# Patient Record
Sex: Female | Born: 1950 | Race: White | Hispanic: No | Marital: Married | Smoking: Former smoker
Health system: Southern US, Community
[De-identification: ages and names within clinical notes are randomized; demographics above are authoritative.]

## PROBLEM LIST (undated history)

## (undated) DIAGNOSIS — E785 Hyperlipidemia, unspecified: Secondary | ICD-10-CM

## (undated) DIAGNOSIS — A159 Respiratory tuberculosis unspecified: Secondary | ICD-10-CM

## (undated) DIAGNOSIS — H409 Unspecified glaucoma: Secondary | ICD-10-CM

## (undated) DIAGNOSIS — E7800 Pure hypercholesterolemia, unspecified: Secondary | ICD-10-CM

## (undated) DIAGNOSIS — E669 Obesity, unspecified: Secondary | ICD-10-CM

## (undated) DIAGNOSIS — H25019 Cortical age-related cataract, unspecified eye: Secondary | ICD-10-CM

## (undated) DIAGNOSIS — J342 Deviated nasal septum: Secondary | ICD-10-CM

## (undated) DIAGNOSIS — M1990 Unspecified osteoarthritis, unspecified site: Secondary | ICD-10-CM

## (undated) DIAGNOSIS — T7840XA Allergy, unspecified, initial encounter: Secondary | ICD-10-CM

## (undated) DIAGNOSIS — I10 Essential (primary) hypertension: Secondary | ICD-10-CM

## (undated) DIAGNOSIS — J449 Chronic obstructive pulmonary disease, unspecified: Secondary | ICD-10-CM

## (undated) DIAGNOSIS — E119 Type 2 diabetes mellitus without complications: Secondary | ICD-10-CM

## (undated) DIAGNOSIS — J45909 Unspecified asthma, uncomplicated: Secondary | ICD-10-CM

## (undated) HISTORY — DX: Unspecified asthma, uncomplicated: J45.909

## (undated) HISTORY — DX: Hyperlipidemia, unspecified: E78.5

## (undated) HISTORY — DX: Essential (primary) hypertension: I10

## (undated) HISTORY — PX: CT ARTHROGRAM KNEE RT WO (ARMC HX): HXRAD992

## (undated) HISTORY — DX: Allergy, unspecified, initial encounter: T78.40XA

## (undated) HISTORY — PX: NASAL FRACTURE SURGERY: SHX718

---

## 2007-01-22 ENCOUNTER — Emergency Department: Payer: Self-pay | Admitting: Emergency Medicine

## 2007-06-19 ENCOUNTER — Ambulatory Visit: Payer: Self-pay | Admitting: Unknown Physician Specialty

## 2007-07-30 ENCOUNTER — Ambulatory Visit: Payer: Self-pay | Admitting: Family Medicine

## 2011-02-09 ENCOUNTER — Ambulatory Visit: Payer: Self-pay | Admitting: Unknown Physician Specialty

## 2011-02-13 LAB — PATHOLOGY REPORT

## 2011-05-23 ENCOUNTER — Ambulatory Visit: Payer: Self-pay | Admitting: Family Medicine

## 2011-06-01 ENCOUNTER — Ambulatory Visit: Payer: Self-pay | Admitting: Family Medicine

## 2012-09-18 ENCOUNTER — Ambulatory Visit: Payer: Self-pay | Admitting: Family Medicine

## 2013-09-19 ENCOUNTER — Ambulatory Visit: Payer: Self-pay | Admitting: Family Medicine

## 2013-09-30 ENCOUNTER — Ambulatory Visit: Payer: Self-pay | Admitting: Family Medicine

## 2014-11-03 ENCOUNTER — Ambulatory Visit: Payer: Self-pay | Admitting: Family Medicine

## 2015-09-16 DIAGNOSIS — Z23 Encounter for immunization: Secondary | ICD-10-CM | POA: Diagnosis not present

## 2015-10-12 ENCOUNTER — Other Ambulatory Visit: Payer: Self-pay | Admitting: Family Medicine

## 2015-10-12 DIAGNOSIS — Z1231 Encounter for screening mammogram for malignant neoplasm of breast: Secondary | ICD-10-CM

## 2015-10-12 DIAGNOSIS — J309 Allergic rhinitis, unspecified: Secondary | ICD-10-CM | POA: Diagnosis not present

## 2015-10-12 DIAGNOSIS — Z136 Encounter for screening for cardiovascular disorders: Secondary | ICD-10-CM | POA: Diagnosis not present

## 2015-10-12 DIAGNOSIS — I10 Essential (primary) hypertension: Secondary | ICD-10-CM | POA: Diagnosis not present

## 2015-10-12 DIAGNOSIS — Z23 Encounter for immunization: Secondary | ICD-10-CM | POA: Diagnosis not present

## 2015-10-12 DIAGNOSIS — Z9181 History of falling: Secondary | ICD-10-CM | POA: Diagnosis not present

## 2015-10-12 DIAGNOSIS — Z0000 Encounter for general adult medical examination without abnormal findings: Secondary | ICD-10-CM | POA: Diagnosis not present

## 2015-10-12 DIAGNOSIS — Z1389 Encounter for screening for other disorder: Secondary | ICD-10-CM | POA: Diagnosis not present

## 2015-10-12 DIAGNOSIS — J45909 Unspecified asthma, uncomplicated: Secondary | ICD-10-CM | POA: Diagnosis not present

## 2015-10-12 DIAGNOSIS — R7303 Prediabetes: Secondary | ICD-10-CM | POA: Diagnosis not present

## 2015-10-12 DIAGNOSIS — E2839 Other primary ovarian failure: Secondary | ICD-10-CM

## 2015-10-25 ENCOUNTER — Encounter: Payer: Medicare Other | Attending: Family Medicine | Admitting: Dietician

## 2015-10-25 ENCOUNTER — Encounter: Payer: Self-pay | Admitting: Dietician

## 2015-10-25 VITALS — BP 132/78 | Ht 65.0 in | Wt 246.5 lb

## 2015-10-25 DIAGNOSIS — E119 Type 2 diabetes mellitus without complications: Secondary | ICD-10-CM | POA: Diagnosis not present

## 2015-10-25 NOTE — Patient Instructions (Signed)
  Check blood sugars 2 x day before breakfast and 2 hrs after supper every day Exercise:  Start walking  for   15-30  minutes   4  days a week Avoid sugar sweetened drinks (soda, tea, coffee, sports drinks, fruit juices) Limit intake of snack foods and sweets/desserts Make healthy food choices Eat 3 meals day,  1  snack a day at bedtime Space meals 4-6 hours apart Bring blood sugar records to the next appointment/class Return for appointment/classes on:  11-18-15

## 2015-10-25 NOTE — Progress Notes (Signed)
Diabetes Self-Management Education  Visit Type: First/Initial  Appt. Start Time: 1100   Appt. End Time:1200  10/25/2015  Ms. Sena Slate, identified by name and date of birth, is a 64 y.o. female with a diagnosis of Diabetes: Type 2.   ASSESSMENT  Blood pressure 132/78, height 5\' 5"  (1.651 m), weight 246 lb 8 oz (111.812 kg). Body mass index is 41.02 kg/(m^2). Obesity Lacks knowledge of diabetes management Pt reports recent BP was elevated and MD doubled  her Maxzide dose      Diabetes Self-Management Education - 10/25/15 1238    Visit Information   Visit Type First/Initial   Initial Visit   Diabetes Type Type 2   Health Coping   How would you rate your overall health? Fair   Psychosocial Assessment   Patient Belief/Attitude about Diabetes Motivated to manage diabetes   Self-care barriers None   Special Needs None   Preferred Learning Style Hands on   Learning Readiness Ready   Complications   How often do you check your blood sugar? 0 times/day (not testing)   Have you had a dilated eye exam in the past 12 months? Yes   Have you had a dental exam in the past 12 months? Yes   Are you checking your feet? Yes   How many days per week are you checking your feet? 1   Dietary Intake   Lunch --  eats sweets 4-5x/day   Snack (afternoon) --  eats snack at 3p=chips, pretzels or ice cream   Snack (evening) --  eats snack at 8-9p=chips, pretzels or ice cream   Beverage(s) --  drinks 6-7 glasses of water per day + unsweetened beverages 2-3/day   Exercise   Exercise Type ADL's  no regular exercise   Patient Education   Previous Diabetes Education No   Disease state  --  discussed type 2 diabets and how diagnosed and treatment options   Nutrition management  Role of diet in the treatment of diabetes and the relationship between the three main macronutrients and blood glucose level;Food label reading, portion sizes and measuring food.;Carbohydrate counting   Physical activity  and exercise  Role of exercise on diabetes management, blood pressure control and cardiac health.   Monitoring Taught/evaluated SMBG meter.;Purpose and frequency of SMBG.;Yearly dilated eye exam;Taught/discussed recording of test results and interpretation of SMBG.;Identified appropriate SMBG and/or A1C goals.  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 Patient education plan is to attend individual and/or group sessions per assessed needs and concerns.     Patient Instructions   Check blood sugars 2 x day before breakfast and 2 hrs after supper every day Exercise:  Start walking  for   15-30  minutes   4  days a week Avoid sugar sweetened drinks (soda, tea, coffee, sports drinks, fruit juices) Limit intake of snack foods and sweets/desserts Make healthy food choices Eat 3 meals day,  1  snack a day at bedtime Space meals 4-6 hours apart Bring blood sugar records to the next appointment/class Return for  appointment/classes on:  11-18-15    Education material provided: General Meal Planning Guidelines, Accuchek Aviva Connect meter with 10 test strips  If problems or questions, patient to contact team via:  [phone]  Future DSME appointment:  11-18-15

## 2015-11-17 ENCOUNTER — Ambulatory Visit
Admission: RE | Admit: 2015-11-17 | Discharge: 2015-11-17 | Disposition: A | Discharge status: Home / Self Care | Payer: Medicare Other | Source: Ambulatory Visit | Attending: Family Medicine | Admitting: Family Medicine

## 2015-11-17 DIAGNOSIS — Z1231 Encounter for screening mammogram for malignant neoplasm of breast: Secondary | ICD-10-CM | POA: Diagnosis not present

## 2015-11-17 DIAGNOSIS — E2839 Other primary ovarian failure: Secondary | ICD-10-CM | POA: Insufficient documentation

## 2015-11-17 DIAGNOSIS — M8588 Other specified disorders of bone density and structure, other site: Secondary | ICD-10-CM | POA: Diagnosis not present

## 2015-11-17 DIAGNOSIS — Z780 Asymptomatic menopausal state: Secondary | ICD-10-CM | POA: Insufficient documentation

## 2015-11-18 ENCOUNTER — Encounter: Payer: Self-pay | Admitting: Dietician

## 2015-11-18 ENCOUNTER — Encounter: Payer: Medicare Other | Admitting: Dietician

## 2015-11-18 VITALS — Ht 65.0 in | Wt 241.0 lb

## 2015-11-18 DIAGNOSIS — E119 Type 2 diabetes mellitus without complications: Secondary | ICD-10-CM

## 2015-11-18 NOTE — Progress Notes (Signed)
Appt. Start Time: 9:00am Appt. End Time: 12:00 pm  Class 1 Diabetes Overview - define DM; state own type of DM; identify functions of pancreas and insulin; define insulin deficiency vs insulin resistance  Psychosocial - identify DM as a source of stress; state the effects of stress on BG control; verbalize appropriate stress management techniques; identify personal stress issues   Nutritional Management - describe effects of food on blood glucose; identify sources of carbohydrate, protein and fat; verbalize the importance of balance meals in controlling blood glucose; identify meals as well balanced or not; estimate servings of carbohydrate from menus; use food labels to identify servings size, content of carbohydrate, fiber, protein, fat, saturated fat and sodium; recognize food sources of fat, saturated fat, trans fat, sodium and verbalize goals for intake; describe healthful appropriate food choices when dining out   Exercise - describe the effects of exercise on blood glucose and importance of regular exercise in controlling diabetes; state a plan for personal exercise; verbalize contraindications for exercise  Self-Monitoring - state importance of HBGM and demo procedure accurately; use HBGM results to effectively manage diabetes; identify importance of regular HbA1C testing and goals for results  Acute Complications/Sick Day Guidelines - recognize hyperglycemia and hypoglycemia with causes and effects; identify blood glucose results as high, low or in control; list steps in treating and preventing high and low blood glucose; state appropriate measure to manage blood glucose when ill (need for meds, HBGM plan, when to call physician, need for fluids)  Chronic Complications/Foot, Skin, Eye Dental Care - identify possible long-term complications of diabetes (retinopathy, neuropathy, nephropathy, cardiovascular disease, infections); explain steps in prevention and treatment of chronic complications;  state importance of daily self-foot exams; describe how to examine feet and what to look for; explain appropriate eye and dental care  Lifestyle Changes/Goals & Health/Community Resources - state benefits of making appropriate lifestyle changes; identify habits that need to change (meals, tobacco, alcohol); identify strategies to reduce risk factors for personal health; set goals for proper diabetes care; state need for and frequency of healthcare follow-up; describe appropriate community resources for good health (ADA, web sites, apps)   Pregnancy/Sexual Health - define gestational diabetes; state importance of good blood glucose control and birth control prior to pregnancy; state importance of good blood glucose control in preventing sexual problems (impotence, vaginal dryness, infections, loss of desire); state relationship of blood glucose control and pregnancy outcome; describe risk of maternal and fetal complications  Teaching Materials Used: Class 1 Slides/Notebook Diabetes Booklet ID Card  Medic Alert/Medic ID Forms Sleep Evaluation Exercise Handout Daily Food Record Planning a Balanced Meal Goals for Class 1 

## 2015-11-25 ENCOUNTER — Encounter: Payer: Medicare Other | Attending: Family Medicine | Admitting: *Deleted

## 2015-11-25 ENCOUNTER — Encounter: Payer: Self-pay | Admitting: *Deleted

## 2015-11-25 VITALS — Wt 239.2 lb

## 2015-11-25 DIAGNOSIS — E119 Type 2 diabetes mellitus without complications: Secondary | ICD-10-CM

## 2015-11-25 NOTE — Progress Notes (Signed)

## 2015-11-26 DIAGNOSIS — E119 Type 2 diabetes mellitus without complications: Secondary | ICD-10-CM | POA: Diagnosis not present

## 2015-12-02 ENCOUNTER — Encounter: Payer: Medicare Other | Admitting: Dietician

## 2015-12-02 ENCOUNTER — Encounter: Payer: Self-pay | Admitting: Dietician

## 2015-12-02 VITALS — BP 128/82 | Ht 65.0 in | Wt 236.9 lb

## 2015-12-02 DIAGNOSIS — R0602 Shortness of breath: Secondary | ICD-10-CM | POA: Diagnosis not present

## 2015-12-02 DIAGNOSIS — E119 Type 2 diabetes mellitus without complications: Secondary | ICD-10-CM

## 2015-12-02 DIAGNOSIS — G479 Sleep disorder, unspecified: Secondary | ICD-10-CM | POA: Diagnosis not present

## 2015-12-02 DIAGNOSIS — R0609 Other forms of dyspnea: Secondary | ICD-10-CM | POA: Diagnosis not present

## 2015-12-02 DIAGNOSIS — J449 Chronic obstructive pulmonary disease, unspecified: Secondary | ICD-10-CM | POA: Diagnosis not present

## 2015-12-02 DIAGNOSIS — J45909 Unspecified asthma, uncomplicated: Secondary | ICD-10-CM | POA: Diagnosis not present

## 2015-12-02 DIAGNOSIS — E6609 Other obesity due to excess calories: Secondary | ICD-10-CM | POA: Diagnosis not present

## 2015-12-02 DIAGNOSIS — Z8611 Personal history of tuberculosis: Secondary | ICD-10-CM | POA: Diagnosis not present

## 2015-12-02 NOTE — Progress Notes (Signed)
Appt. Start Time: 9:00   Appt. End Time: 12:00  Class 3 Psychosocial - identify DM as a source of stress; state the effects of stress on BG control; verbalize appropriate stress management techniques; identify personal stress issues   Nutritional Management - describe effects of food on blood glucose; identify sources of carbohydrate, protein and fat; verbalize the importance of balance meals in controlling blood glucose; identify meals as well balanced or not; estimate servings of carbohydrate from menus; use food labels to identify servings size, content of carbohydrate, fiber, protein, fat, saturated fat and sodium; recognize food sources of fat, saturated fat, trans fat, sodium and verbalize goals for intake; describe healthful appropriate food choices when dining out   Exercise - describe the effects of exercise on blood glucose and importance of regular exercise in controlling diabetes; state a plan for personal exercise; verbalize contraindications for exercise  Self-Monitoring - state importance of HBGM and demo procedure accurately; use HBGM results to effectively manage diabetes; identify importance of regular HbA1C testing and goals for results  Acute Complications/Sick Day Guidelines - recognize hyperglycemia and hypoglycemia with causes and effects; identify blood glucose results as high, low or in control; list steps in treating and preventing high and low blood glucose; state appropriate measure to manage blood glucose when ill (need for meds, HBGM plan, when to call physician, need for fluids)  Chronic Complications/Foot, Skin, Eye Dental Care - identify possible long-term complications of diabetes (retinopathy, neuropathy, nephropathy, cardiovascular disease, infections); explain steps in prevention and treatment of chronic complications; state importance of daily self-foot exams; describe how to examine feet and what to look for; explain appropriate eye and dental care  Lifestyle  Changes/Goals & Health/Community Resources - state benefits of making appropriate lifestyle changes; identify habits that need to change (meals, tobacco, alcohol); identify strategies to reduce risk factors for personal health; set goals for proper diabetes care; state need for and frequency of healthcare follow-up; describe appropriate community resources for good health (ADA, web sites, apps)   Teaching Materials Used: Class 3 Slide Packet Diabetes Stress Test Stress Management Tools Stress Poem Recipe/Menu Booklet Nutrition Prescription Fast Food Information Goal Setting Worksheet    

## 2015-12-13 ENCOUNTER — Encounter: Payer: Self-pay | Admitting: Dietician

## 2015-12-13 NOTE — Progress Notes (Signed)
Discharge letter faxed to MD 

## 2016-01-04 DIAGNOSIS — Z86010 Personal history of colonic polyps: Secondary | ICD-10-CM | POA: Diagnosis not present

## 2016-01-13 DIAGNOSIS — E669 Obesity, unspecified: Secondary | ICD-10-CM | POA: Diagnosis not present

## 2016-01-13 DIAGNOSIS — E1165 Type 2 diabetes mellitus with hyperglycemia: Secondary | ICD-10-CM | POA: Diagnosis not present

## 2016-01-13 DIAGNOSIS — E119 Type 2 diabetes mellitus without complications: Secondary | ICD-10-CM | POA: Diagnosis not present

## 2016-01-13 DIAGNOSIS — Z6839 Body mass index (BMI) 39.0-39.9, adult: Secondary | ICD-10-CM | POA: Diagnosis not present

## 2016-01-13 DIAGNOSIS — E7800 Pure hypercholesterolemia, unspecified: Secondary | ICD-10-CM | POA: Diagnosis not present

## 2016-01-13 DIAGNOSIS — I10 Essential (primary) hypertension: Secondary | ICD-10-CM | POA: Diagnosis not present

## 2016-01-14 ENCOUNTER — Encounter: Payer: Self-pay | Admitting: *Deleted

## 2016-01-17 ENCOUNTER — Ambulatory Visit: Payer: Medicare Other | Admitting: Anesthesiology

## 2016-01-17 ENCOUNTER — Ambulatory Visit
Admission: RE | Admit: 2016-01-17 | Discharge: 2016-01-17 | Disposition: A | Discharge status: Home / Self Care | Payer: Medicare Other | Source: Ambulatory Visit | Attending: Unknown Physician Specialty | Admitting: Unknown Physician Specialty

## 2016-01-17 ENCOUNTER — Encounter: Payer: Self-pay | Admitting: Anesthesiology

## 2016-01-17 ENCOUNTER — Encounter
Admission: RE | Disposition: A | Discharge status: Home / Self Care | Payer: Self-pay | Source: Ambulatory Visit | Attending: Unknown Physician Specialty

## 2016-01-17 DIAGNOSIS — Z8611 Personal history of tuberculosis: Secondary | ICD-10-CM | POA: Insufficient documentation

## 2016-01-17 DIAGNOSIS — E119 Type 2 diabetes mellitus without complications: Secondary | ICD-10-CM | POA: Diagnosis not present

## 2016-01-17 DIAGNOSIS — Z8371 Family history of colonic polyps: Secondary | ICD-10-CM | POA: Insufficient documentation

## 2016-01-17 DIAGNOSIS — Z888 Allergy status to other drugs, medicaments and biological substances status: Secondary | ICD-10-CM | POA: Diagnosis not present

## 2016-01-17 DIAGNOSIS — Z823 Family history of stroke: Secondary | ICD-10-CM | POA: Insufficient documentation

## 2016-01-17 DIAGNOSIS — I10 Essential (primary) hypertension: Secondary | ICD-10-CM | POA: Insufficient documentation

## 2016-01-17 DIAGNOSIS — Z8249 Family history of ischemic heart disease and other diseases of the circulatory system: Secondary | ICD-10-CM | POA: Insufficient documentation

## 2016-01-17 DIAGNOSIS — K621 Rectal polyp: Secondary | ICD-10-CM | POA: Insufficient documentation

## 2016-01-17 DIAGNOSIS — Z79899 Other long term (current) drug therapy: Secondary | ICD-10-CM | POA: Diagnosis not present

## 2016-01-17 DIAGNOSIS — Z881 Allergy status to other antibiotic agents status: Secondary | ICD-10-CM | POA: Diagnosis not present

## 2016-01-17 DIAGNOSIS — J449 Chronic obstructive pulmonary disease, unspecified: Secondary | ICD-10-CM | POA: Diagnosis not present

## 2016-01-17 DIAGNOSIS — Z1211 Encounter for screening for malignant neoplasm of colon: Secondary | ICD-10-CM | POA: Diagnosis not present

## 2016-01-17 DIAGNOSIS — Z825 Family history of asthma and other chronic lower respiratory diseases: Secondary | ICD-10-CM | POA: Diagnosis not present

## 2016-01-17 DIAGNOSIS — Z7982 Long term (current) use of aspirin: Secondary | ICD-10-CM | POA: Insufficient documentation

## 2016-01-17 DIAGNOSIS — Z880 Allergy status to penicillin: Secondary | ICD-10-CM | POA: Diagnosis not present

## 2016-01-17 DIAGNOSIS — Z801 Family history of malignant neoplasm of trachea, bronchus and lung: Secondary | ICD-10-CM | POA: Diagnosis not present

## 2016-01-17 DIAGNOSIS — E7800 Pure hypercholesterolemia, unspecified: Secondary | ICD-10-CM | POA: Insufficient documentation

## 2016-01-17 DIAGNOSIS — Z86010 Personal history of colonic polyps: Secondary | ICD-10-CM | POA: Diagnosis not present

## 2016-01-17 DIAGNOSIS — D125 Benign neoplasm of sigmoid colon: Secondary | ICD-10-CM | POA: Diagnosis not present

## 2016-01-17 DIAGNOSIS — K640 First degree hemorrhoids: Secondary | ICD-10-CM | POA: Insufficient documentation

## 2016-01-17 DIAGNOSIS — Z8489 Family history of other specified conditions: Secondary | ICD-10-CM | POA: Diagnosis not present

## 2016-01-17 DIAGNOSIS — E785 Hyperlipidemia, unspecified: Secondary | ICD-10-CM | POA: Diagnosis not present

## 2016-01-17 DIAGNOSIS — K635 Polyp of colon: Secondary | ICD-10-CM | POA: Diagnosis not present

## 2016-01-17 DIAGNOSIS — Z841 Family history of disorders of kidney and ureter: Secondary | ICD-10-CM | POA: Diagnosis not present

## 2016-01-17 DIAGNOSIS — Z833 Family history of diabetes mellitus: Secondary | ICD-10-CM | POA: Insufficient documentation

## 2016-01-17 HISTORY — DX: Chronic obstructive pulmonary disease, unspecified: J44.9

## 2016-01-17 HISTORY — DX: Pure hypercholesterolemia, unspecified: E78.00

## 2016-01-17 HISTORY — PX: COLONOSCOPY WITH PROPOFOL: SHX5780

## 2016-01-17 HISTORY — DX: Respiratory tuberculosis unspecified: A15.9

## 2016-01-17 LAB — GLUCOSE, CAPILLARY: Glucose-Capillary: 114 mg/dL — ABNORMAL HIGH (ref 65–99)

## 2016-01-17 SURGERY — COLONOSCOPY WITH PROPOFOL
Anesthesia: General

## 2016-01-17 MED ORDER — MIDAZOLAM HCL 5 MG/5ML IJ SOLN
INTRAMUSCULAR | Status: DC | PRN
  Administered 2016-01-17: 1 mg via INTRAVENOUS

## 2016-01-17 MED ORDER — LIDOCAINE HCL (PF) 2 % IJ SOLN
INTRAMUSCULAR | Status: DC | PRN
  Administered 2016-01-17: 60 mg

## 2016-01-17 MED ORDER — SODIUM CHLORIDE 0.9 % IV SOLN: INTRAVENOUS | Status: DC

## 2016-01-17 MED ORDER — PROPOFOL 10 MG/ML IV BOLUS
INTRAVENOUS | Status: DC | PRN
  Administered 2016-01-17: 10 mg via INTRAVENOUS
  Administered 2016-01-17: 50 mg via INTRAVENOUS

## 2016-01-17 MED ORDER — FENTANYL CITRATE (PF) 100 MCG/2ML IJ SOLN
INTRAMUSCULAR | Status: DC | PRN
  Administered 2016-01-17: 50 ug via INTRAVENOUS

## 2016-01-17 MED ORDER — PROPOFOL 500 MG/50ML IV EMUL
INTRAVENOUS | Status: DC | PRN
  Administered 2016-01-17: 75 ug/kg/min via INTRAVENOUS

## 2016-01-17 MED ORDER — SODIUM CHLORIDE 0.9 % IV SOLN
INTRAVENOUS | Status: DC
  Administered 2016-01-17: 1000 mL via INTRAVENOUS

## 2016-01-17 NOTE — Anesthesia Preprocedure Evaluation (Signed)
Anesthesia Evaluation  Patient identified by MRN, date of birth, ID band Patient awake    Reviewed: Allergy & Precautions, NPO status , Patient's Chart, lab work & pertinent test results  Airway Mallampati: III  TM Distance: <3 FB Neck ROM: Full    Dental  (+) Caps, Chipped   Pulmonary asthma , COPD,  COPD inhaler,    Pulmonary exam normal breath sounds clear to auscultation       Cardiovascular hypertension, Pt. on medications Normal cardiovascular exam     Neuro/Psych negative neurological ROS  negative psych ROS   GI/Hepatic negative GI ROS, Neg liver ROS,   Endo/Other  negative endocrine ROS  Renal/GU negative Renal ROS  negative genitourinary   Musculoskeletal negative musculoskeletal ROS (+)   Abdominal Normal abdominal exam  (+)   Peds negative pediatric ROS (+)  Hematology negative hematology ROS (+)   Anesthesia Other Findings   Reproductive/Obstetrics                             Anesthesia Physical Anesthesia Plan  ASA: III  Anesthesia Plan: General   Post-op Pain Management:    Induction: Intravenous  Airway Management Planned: Nasal Cannula  Additional Equipment:   Intra-op Plan:   Post-operative Plan:   Informed Consent: I have reviewed the patients History and Physical, chart, labs and discussed the procedure including the risks, benefits and alternatives for the proposed anesthesia with the patient or authorized representative who has indicated his/her understanding and acceptance.   Dental advisory given  Plan Discussed with: CRNA and Surgeon  Anesthesia Plan Comments:         Anesthesia Quick Evaluation

## 2016-01-17 NOTE — Anesthesia Postprocedure Evaluation (Signed)
Anesthesia Post Note  Patient: Carmen Gonzalez  Procedure(s) Performed: Procedure(s) (LRB): COLONOSCOPY WITH PROPOFOL (N/A)  Patient location during evaluation: PACU Anesthesia Type: General Level of consciousness: awake and alert and oriented Pain management: pain level controlled Vital Signs Assessment: post-procedure vital signs reviewed and stable Respiratory status: spontaneous breathing Cardiovascular status: blood pressure returned to baseline Anesthetic complications: no    Last Vitals:  Filed Vitals:   01/17/16 0936 01/17/16 0946  BP: 128/73 112/57  Pulse: 86 82  Temp:    Resp: 16 15    Last Pain:  Filed Vitals:   01/17/16 0952  PainSc: Asleep                 Marylou Wages

## 2016-01-17 NOTE — Transfer of Care (Signed)
Immediate Anesthesia Transfer of Care Note  Patient: Carmen Gonzalez  Procedure(s) Performed: Procedure(s): COLONOSCOPY WITH PROPOFOL (N/A)  Patient Location: PACU  Anesthesia Type:General  Level of Consciousness: sedated  Airway & Oxygen Therapy: Patient Spontanous Breathing and Patient connected to nasal cannula oxygen  Post-op Assessment: Report given to RN and Post -op Vital signs reviewed and stable  Post vital signs: Reviewed and stable  Last Vitals:  Filed Vitals:   01/17/16 0811  BP: 121/77  Pulse: 108  Temp: 37 C  Resp: 16    Complications: No apparent anesthesia complications

## 2016-01-17 NOTE — H&P (Signed)
Primary Care Physician:  Leonides Sake, MD Primary Gastroenterologist:  Dr. Vira Agar  Pre-Procedure History & Physical: HPI:  Carmen Gonzalez is a 65 y.o. female is here for an colonoscopy.   Past Medical History  Diagnosis Date  . Hypertension   . Hyperlipidemia   . Allergy   . Asthma   . COPD (chronic obstructive pulmonary disease) ([place])   . Hypercholesteremia   . Tuberculosis     History reviewed. No pertinent past surgical history.  Prior to Admission medications   Medication Sig Start Date End Date Taking? Authorizing Provider  ACCU-CHEK AVIVA PLUS test strip 1 (ONE) STRIP IN VITRO TWICE A DAY , FASTING AND 2 HOURS AFTER ANY MEAL 10/29/15  Yes Historical Provider, MD  ACCU-CHEK FASTCLIX LANCETS MISC CHECK SUGAR TWICE A DAY 10/29/15  Yes Historical Provider, MD  albuterol (PROVENTIL HFA;VENTOLIN HFA) 108 (90 BASE) MCG/ACT inhaler Inhale 2 puffs into the lungs every 6 (six) hours as needed for wheezing or shortness of breath.   Yes Historical Provider, MD  aspirin 81 MG tablet Take 81 mg by mouth daily.   Yes Historical Provider, MD  Blood Glucose Monitoring Suppl (ACCU-CHEK AVIVA PLUS) w/Device KIT USE KIT AS DIRECTED 10/29/15  Yes Historical Provider, MD  Fluticasone-Salmeterol (ADVAIR) 100-50 MCG/DOSE AEPB Inhale 1 puff into the lungs 2 (two) times daily.   Yes Historical Provider, MD  loratadine (CLARITIN) 10 MG tablet Take 10 mg by mouth daily.   Yes Historical Provider, MD  montelukast (SINGULAIR) 10 MG tablet Take 10 mg by mouth daily.   Yes Historical Provider, MD  naproxen sodium (ANAPROX) 220 MG tablet Take 220 mg by mouth 2 (two) times daily with a meal. As needed for headache   Yes Historical Provider, MD  triamterene-hydrochlorothiazide (MAXZIDE-25) 37.5-25 MG tablet Take 2 tablets by mouth daily.   Yes Historical Provider, MD  rosuvastatin (CRESTOR) 10 MG tablet Take 10 mg by mouth daily.    Historical Provider, MD    Allergies as of 01/11/2016 - Review Complete  11/25/2015  Allergen Reaction Noted  . Avelox [moxifloxacin hcl in nacl] Anaphylaxis 10/25/2015  . Cephalosporins Other (See Comments) 10/25/2015  . Ciprocin-fluocin-procin [fluocinolone] Other (See Comments) 10/25/2015  . Nabumetone Other (See Comments) 10/25/2015  . Penicillins Other (See Comments) 10/25/2015    History reviewed. No pertinent family history.  Social History   Social History  . Marital Status: Married    Spouse Name: N/A  . Number of Children: N/A  . Years of Education: N/A   Occupational History  . Not on file.   Social History Main Topics  . Smoking status: Never Smoker   . Smokeless tobacco: Not on file  . Alcohol Use: 0.0 - 0.6 oz/week    0-1 Standard drinks or equivalent per week  . Drug Use: Not on file  . Sexual Activity: Not on file   Other Topics Concern  . Not on file   Social History Narrative    Review of Systems: See HPI, otherwise negative ROS  Physical Exam: BP 121/77 mmHg  Pulse 108  Temp(Src) 98.6 F (37 C) (Tympanic)  Resp 16  Ht 5' 5"  (1.651 m)  Wt 105.688 kg (233 lb)  BMI 38.77 kg/m2  SpO2 100% General:   Alert,  pleasant and cooperative in NAD Head:  Normocephalic and atraumatic. Neck:  Supple; no masses or thyromegaly. Lungs:  Clear throughout to auscultation.    Heart:  Regular rate and rhythm. Abdomen:  Soft, nontender and nondistended. Normal  bowel sounds, without guarding, and without rebound.   Neurologic:  Alert and  oriented x4;  grossly normal neurologically.  Impression/Plan: Carmen Gonzalez is here for an colonoscopy to be performed for [facility] colon polyps  Risks, benefits, limitations, and alternatives regarding  colonoscopy have been reviewed with the patient.  Questions have been answered.  All parties agreeable.   Gaylyn Cheers, MD  01/17/2016, 8:32 AM

## 2016-01-17 NOTE — Op Note (Signed)
[facility] Patient Name: Carmen Gonzalez Procedure Date: 01/17/2016 8:36 AM MRN: [id number] Account #: [id number] Date of Birth: 1951-07-05 Admit Type: Outpatient Age: 65 Room: [facility] ENDO ROOM 1 Gender: Female Note Status: Finalized Procedure:            Colonoscopy Indications:          High risk colon cancer surveillance: Personal history                        of colonic polyps Providers:            Manya Silvas, MD Referring MD:         Lorin Mercy. Hamrick, MD (Referring MD) Medicines:            Propofol per Anesthesia Complications:        No immediate complications. Procedure:            Pre-Anesthesia Assessment:                       - After reviewing the risks and benefits, the patient                        was deemed in satisfactory condition to undergo the                        procedure.                       After obtaining informed consent, the colonoscope was                        passed under direct vision. Throughout the procedure,                        the patient's blood pressure, pulse, and oxygen                        saturations were monitored continuously. The                        Colonoscope was introduced through the anus and                        advanced to the the cecum, identified by appendiceal                        orifice and ileocecal valve. The colonoscopy was                        performed without difficulty. The patient tolerated the                        procedure well. The quality of the bowel preparation                        was excellent. Findings:      Four sessile polyps were found in the sigmoid colon. The polyps were       diminutive in size. These polyps were removed with a cold biopsy       forceps. Resection and retrieval were complete.      A diminutive  polyp was found in the rectum. The polyp was sessile. The       polyp was removed with a cold biopsy forceps. Resection and  retrieval       were complete.      Internal hemorrhoids were found during endoscopy. The hemorrhoids were       small and Grade I (internal hemorrhoids that do not prolapse).      The exam was otherwise without abnormality. Impression:           - Four diminutive polyps in the sigmoid colon, removed                        with a cold biopsy forceps. Resected and retrieved.                       - One diminutive polyp in the rectum, removed with a                        cold biopsy forceps. Resected and retrieved.                       - Internal hemorrhoids.                       - The examination was otherwise normal. Recommendation:       - Await pathology results. Manya Silvas, MD 01/17/2016 9:04:14 AM This report has been signed electronically. Number of Addenda: 0 Note Initiated On: 01/17/2016 8:36 AM Scope Withdrawal Time: 0 hours 15 minutes 1 second  Total Procedure Duration: 0 hours 19 minutes 48 seconds       [facility]

## 2016-01-18 LAB — SURGICAL PATHOLOGY

## 2016-01-19 ENCOUNTER — Encounter: Payer: Self-pay | Admitting: Unknown Physician Specialty

## 2016-02-23 DIAGNOSIS — Z6838 Body mass index (BMI) 38.0-38.9, adult: Secondary | ICD-10-CM | POA: Diagnosis not present

## 2016-02-23 DIAGNOSIS — R509 Fever, unspecified: Secondary | ICD-10-CM | POA: Diagnosis not present

## 2016-02-23 DIAGNOSIS — R05 Cough: Secondary | ICD-10-CM | POA: Diagnosis not present

## 2016-04-11 DIAGNOSIS — Z6838 Body mass index (BMI) 38.0-38.9, adult: Secondary | ICD-10-CM | POA: Diagnosis not present

## 2016-04-11 DIAGNOSIS — E1165 Type 2 diabetes mellitus with hyperglycemia: Secondary | ICD-10-CM | POA: Diagnosis not present

## 2016-04-11 DIAGNOSIS — E7800 Pure hypercholesterolemia, unspecified: Secondary | ICD-10-CM | POA: Diagnosis not present

## 2016-04-11 DIAGNOSIS — I10 Essential (primary) hypertension: Secondary | ICD-10-CM | POA: Diagnosis not present

## 2016-05-29 DIAGNOSIS — R0609 Other forms of dyspnea: Secondary | ICD-10-CM | POA: Diagnosis not present

## 2016-05-29 DIAGNOSIS — J449 Chronic obstructive pulmonary disease, unspecified: Secondary | ICD-10-CM | POA: Diagnosis not present

## 2016-05-29 DIAGNOSIS — G479 Sleep disorder, unspecified: Secondary | ICD-10-CM | POA: Diagnosis not present

## 2016-08-05 IMAGING — MG MM DIGITAL SCREENING BILAT W/ CAD
1 series · 4 of 4 positions shown · non-contrast
Comparison: Previous exam(s).

ACR Breast Density Category a: The breast tissue is almost entirely
fatty.

CLINICAL DATA: Screening.

EXAM:
DIGITAL SCREENING BILATERAL MAMMOGRAM WITH CAD

[R CC · right · 4 of 4 slices shown]
[im 1/4]
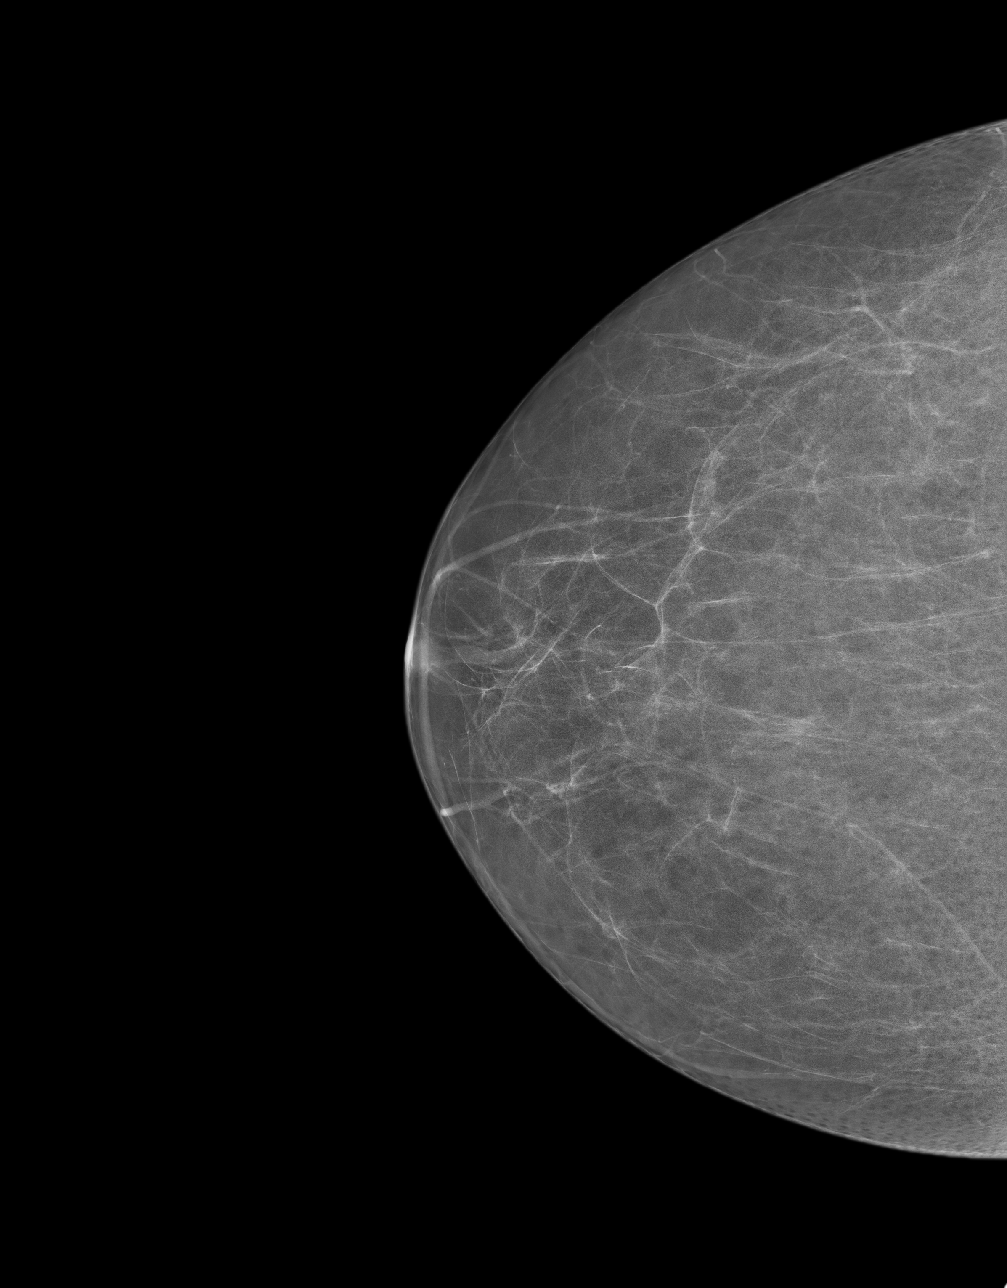
[im 2/4]
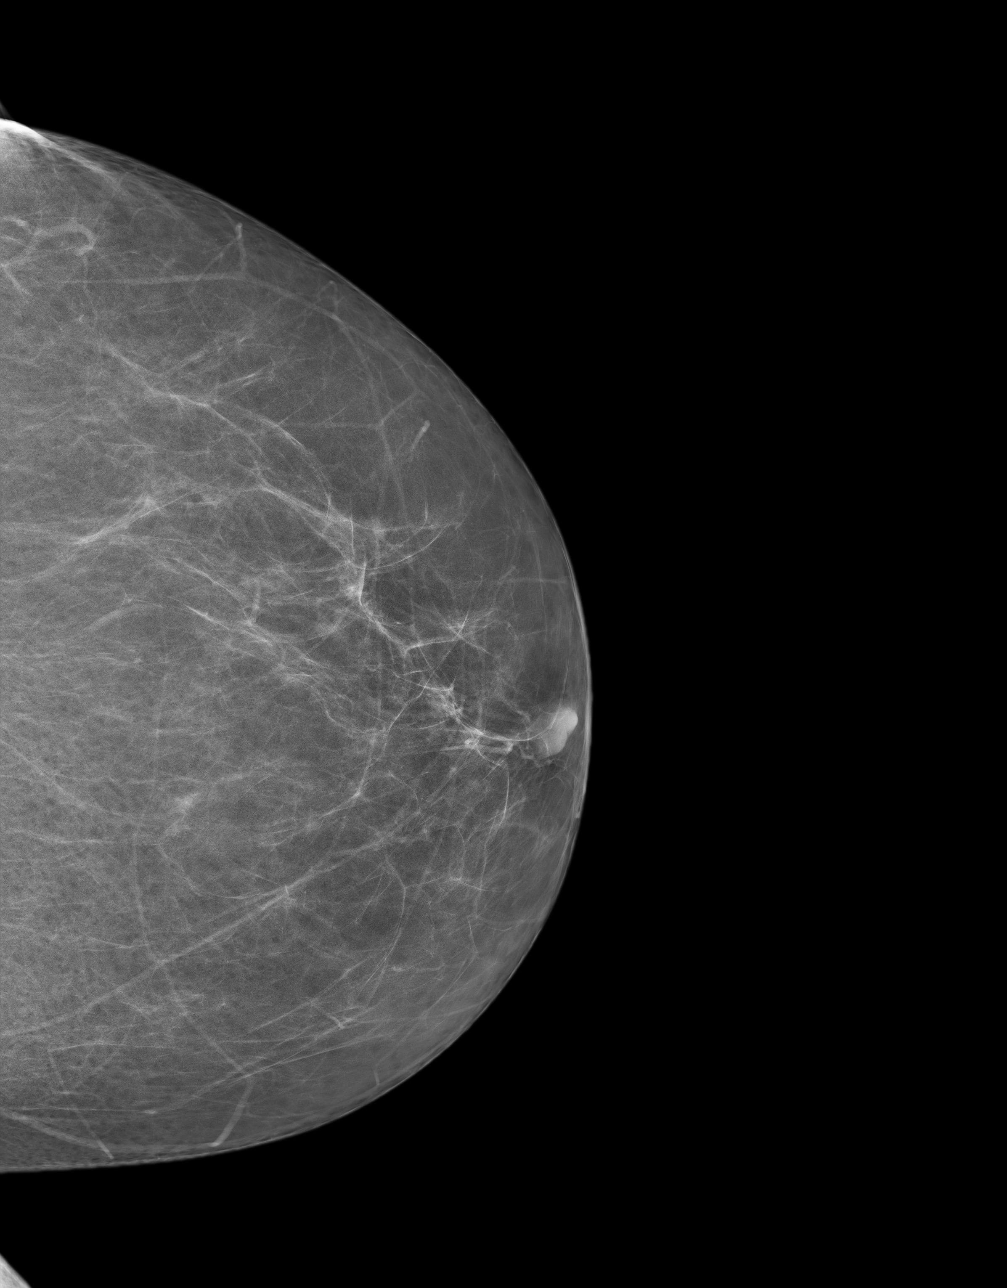
[im 3/4]
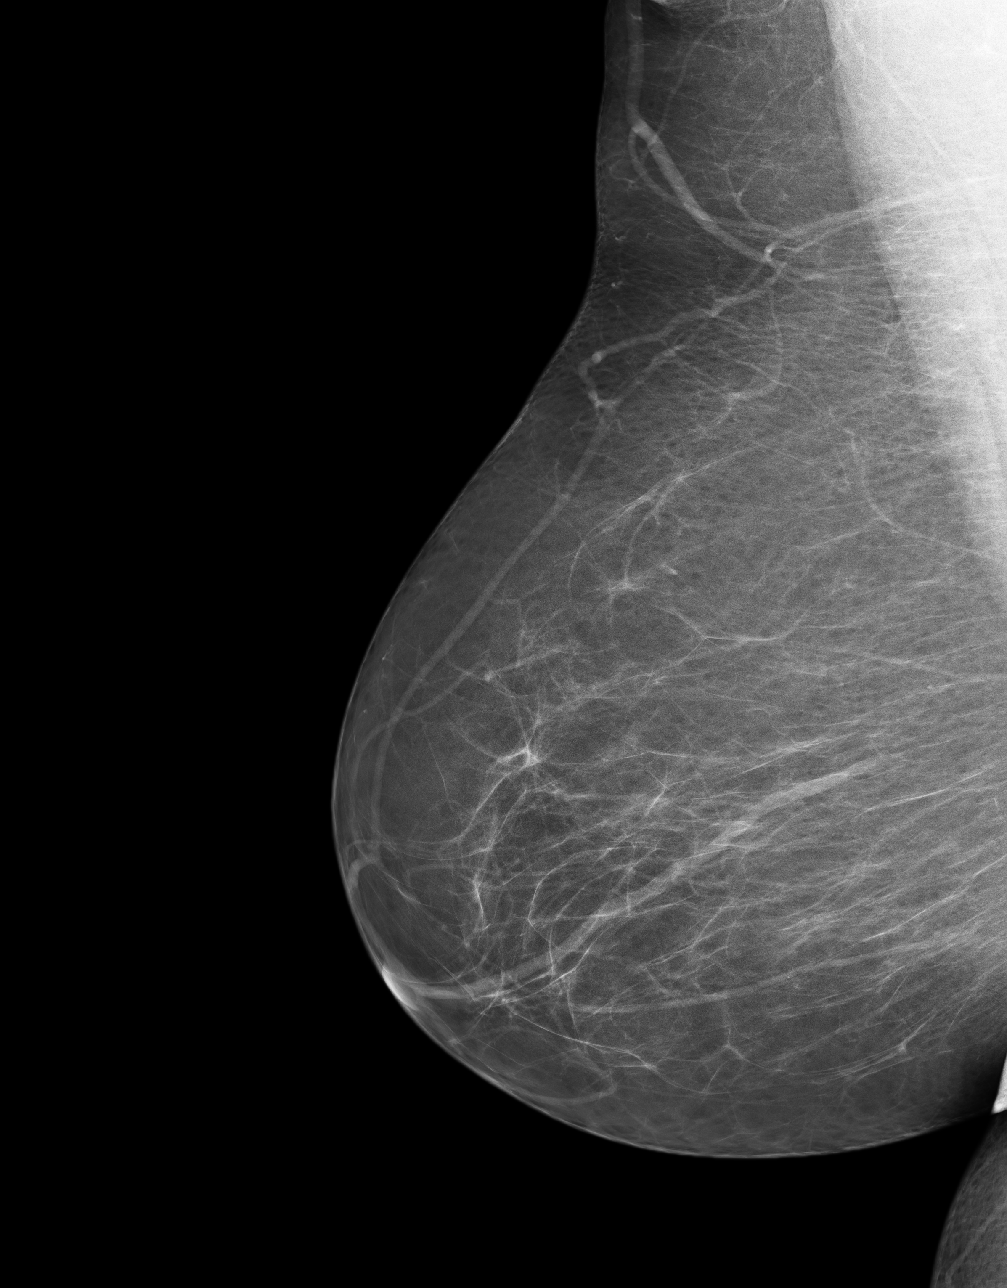
[im 4/4]
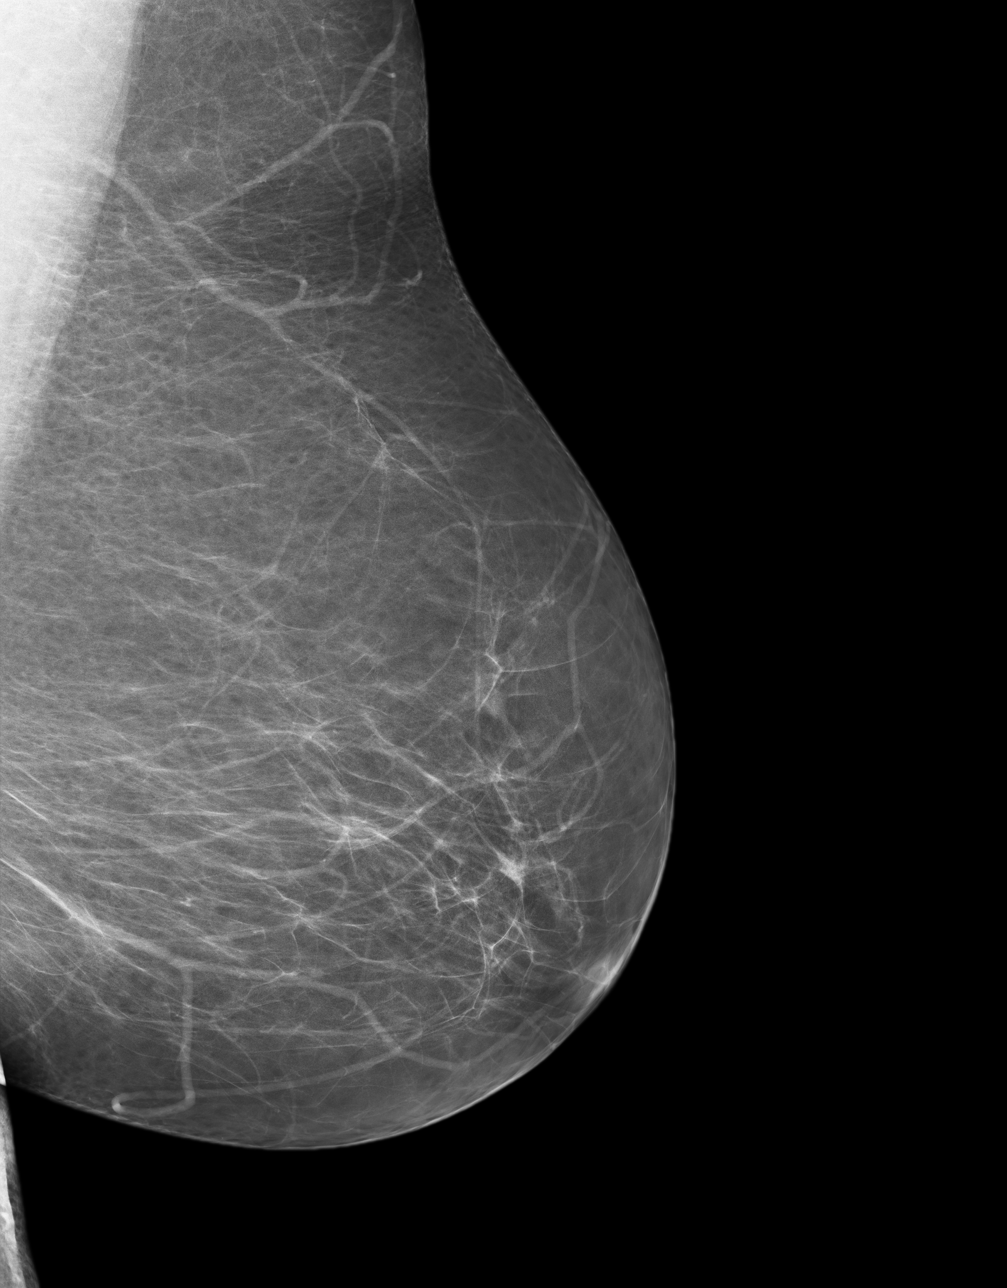

[4 of 4 positions shown; findings below may reference images not displayed]

FINDINGS: There are no findings suspicious for malignancy. Images were
processed with CAD.
IMPRESSION: No mammographic evidence of malignancy. A result letter of this
screening mammogram will be mailed directly to the patient.

RECOMMENDATION:
Screening mammogram in one year. (Code:[id number]-[id number])

BI-RADS CATEGORY  1: Negative.

## 2016-08-10 DIAGNOSIS — M25562 Pain in left knee: Secondary | ICD-10-CM | POA: Diagnosis not present

## 2016-08-10 DIAGNOSIS — M7122 Synovial cyst of popliteal space [Baker], left knee: Secondary | ICD-10-CM | POA: Diagnosis not present

## 2016-09-21 DIAGNOSIS — Z23 Encounter for immunization: Secondary | ICD-10-CM | POA: Diagnosis not present

## 2016-09-27 DIAGNOSIS — J449 Chronic obstructive pulmonary disease, unspecified: Secondary | ICD-10-CM | POA: Diagnosis not present

## 2016-10-17 ENCOUNTER — Other Ambulatory Visit: Payer: Self-pay | Admitting: Physician Assistant

## 2016-10-17 DIAGNOSIS — I10 Essential (primary) hypertension: Secondary | ICD-10-CM | POA: Diagnosis not present

## 2016-10-17 DIAGNOSIS — R252 Cramp and spasm: Secondary | ICD-10-CM | POA: Diagnosis not present

## 2016-10-17 DIAGNOSIS — Z23 Encounter for immunization: Secondary | ICD-10-CM | POA: Diagnosis not present

## 2016-10-17 DIAGNOSIS — M159 Polyosteoarthritis, unspecified: Secondary | ICD-10-CM | POA: Diagnosis not present

## 2016-10-17 DIAGNOSIS — Z1231 Encounter for screening mammogram for malignant neoplasm of breast: Secondary | ICD-10-CM | POA: Diagnosis not present

## 2016-10-17 DIAGNOSIS — E119 Type 2 diabetes mellitus without complications: Secondary | ICD-10-CM | POA: Diagnosis not present

## 2016-10-17 DIAGNOSIS — J449 Chronic obstructive pulmonary disease, unspecified: Secondary | ICD-10-CM | POA: Diagnosis not present

## 2016-10-17 DIAGNOSIS — Z124 Encounter for screening for malignant neoplasm of cervix: Secondary | ICD-10-CM | POA: Diagnosis not present

## 2016-10-17 DIAGNOSIS — Z7689 Persons encountering health services in other specified circumstances: Secondary | ICD-10-CM | POA: Diagnosis not present

## 2016-10-17 DIAGNOSIS — E782 Mixed hyperlipidemia: Secondary | ICD-10-CM | POA: Diagnosis not present

## 2016-10-17 DIAGNOSIS — Z8611 Personal history of tuberculosis: Secondary | ICD-10-CM | POA: Diagnosis not present

## 2016-11-22 ENCOUNTER — Ambulatory Visit
Admission: RE | Admit: 2016-11-22 | Discharge: 2016-11-22 | Disposition: A | Discharge status: Home / Self Care | Payer: Medicare Other | Source: Ambulatory Visit | Attending: Physician Assistant | Admitting: Physician Assistant

## 2016-11-22 DIAGNOSIS — Z1231 Encounter for screening mammogram for malignant neoplasm of breast: Secondary | ICD-10-CM | POA: Insufficient documentation

## 2016-12-12 DIAGNOSIS — L565 Disseminated superficial actinic porokeratosis (DSAP): Secondary | ICD-10-CM | POA: Diagnosis not present

## 2016-12-12 DIAGNOSIS — L570 Actinic keratosis: Secondary | ICD-10-CM | POA: Diagnosis not present

## 2016-12-12 DIAGNOSIS — X32XXXA Exposure to sunlight, initial encounter: Secondary | ICD-10-CM | POA: Diagnosis not present

## 2016-12-12 DIAGNOSIS — L821 Other seborrheic keratosis: Secondary | ICD-10-CM | POA: Diagnosis not present

## 2017-01-05 DIAGNOSIS — M722 Plantar fascial fibromatosis: Secondary | ICD-10-CM | POA: Diagnosis not present

## 2017-01-05 DIAGNOSIS — M79672 Pain in left foot: Secondary | ICD-10-CM | POA: Diagnosis not present

## 2017-01-17 DIAGNOSIS — I10 Essential (primary) hypertension: Secondary | ICD-10-CM | POA: Diagnosis not present

## 2017-01-17 DIAGNOSIS — M159 Polyosteoarthritis, unspecified: Secondary | ICD-10-CM | POA: Diagnosis not present

## 2017-01-17 DIAGNOSIS — E119 Type 2 diabetes mellitus without complications: Secondary | ICD-10-CM | POA: Diagnosis not present

## 2017-01-17 DIAGNOSIS — J449 Chronic obstructive pulmonary disease, unspecified: Secondary | ICD-10-CM | POA: Diagnosis not present

## 2017-01-17 DIAGNOSIS — E782 Mixed hyperlipidemia: Secondary | ICD-10-CM | POA: Diagnosis not present

## 2017-04-03 DIAGNOSIS — J45901 Unspecified asthma with (acute) exacerbation: Secondary | ICD-10-CM | POA: Diagnosis not present

## 2017-04-03 DIAGNOSIS — J441 Chronic obstructive pulmonary disease with (acute) exacerbation: Secondary | ICD-10-CM | POA: Diagnosis not present

## 2017-04-03 DIAGNOSIS — Z6841 Body Mass Index (BMI) 40.0 and over, adult: Secondary | ICD-10-CM | POA: Diagnosis not present

## 2017-04-03 DIAGNOSIS — G479 Sleep disorder, unspecified: Secondary | ICD-10-CM | POA: Diagnosis not present

## 2017-04-12 DIAGNOSIS — D2272 Melanocytic nevi of left lower limb, including hip: Secondary | ICD-10-CM | POA: Diagnosis not present

## 2017-04-12 DIAGNOSIS — D2271 Melanocytic nevi of right lower limb, including hip: Secondary | ICD-10-CM | POA: Diagnosis not present

## 2017-04-12 DIAGNOSIS — L565 Disseminated superficial actinic porokeratosis (DSAP): Secondary | ICD-10-CM | POA: Diagnosis not present

## 2017-04-12 DIAGNOSIS — D225 Melanocytic nevi of trunk: Secondary | ICD-10-CM | POA: Diagnosis not present

## 2017-06-12 DIAGNOSIS — E119 Type 2 diabetes mellitus without complications: Secondary | ICD-10-CM | POA: Diagnosis not present

## 2017-06-18 DIAGNOSIS — E782 Mixed hyperlipidemia: Secondary | ICD-10-CM | POA: Diagnosis not present

## 2017-06-18 DIAGNOSIS — M19042 Primary osteoarthritis, left hand: Secondary | ICD-10-CM | POA: Diagnosis not present

## 2017-06-18 DIAGNOSIS — I10 Essential (primary) hypertension: Secondary | ICD-10-CM | POA: Diagnosis not present

## 2017-06-18 DIAGNOSIS — R490 Dysphonia: Secondary | ICD-10-CM | POA: Diagnosis not present

## 2017-06-18 DIAGNOSIS — E119 Type 2 diabetes mellitus without complications: Secondary | ICD-10-CM | POA: Diagnosis not present

## 2017-06-18 DIAGNOSIS — M19041 Primary osteoarthritis, right hand: Secondary | ICD-10-CM | POA: Diagnosis not present

## 2017-06-18 DIAGNOSIS — J449 Chronic obstructive pulmonary disease, unspecified: Secondary | ICD-10-CM | POA: Diagnosis not present

## 2017-08-23 DIAGNOSIS — M545 Low back pain: Secondary | ICD-10-CM | POA: Diagnosis not present

## 2017-08-23 DIAGNOSIS — M533 Sacrococcygeal disorders, not elsewhere classified: Secondary | ICD-10-CM | POA: Diagnosis not present

## 2017-08-23 DIAGNOSIS — G8929 Other chronic pain: Secondary | ICD-10-CM | POA: Diagnosis not present

## 2017-08-29 DIAGNOSIS — M545 Low back pain: Secondary | ICD-10-CM | POA: Diagnosis not present

## 2017-08-31 DIAGNOSIS — R0602 Shortness of breath: Secondary | ICD-10-CM | POA: Diagnosis not present

## 2017-08-31 DIAGNOSIS — M545 Low back pain: Secondary | ICD-10-CM | POA: Diagnosis not present

## 2017-08-31 DIAGNOSIS — J310 Chronic rhinitis: Secondary | ICD-10-CM | POA: Diagnosis not present

## 2017-08-31 DIAGNOSIS — R05 Cough: Secondary | ICD-10-CM | POA: Diagnosis not present

## 2017-08-31 DIAGNOSIS — J449 Chronic obstructive pulmonary disease, unspecified: Secondary | ICD-10-CM | POA: Diagnosis not present

## 2017-09-03 DIAGNOSIS — M545 Low back pain: Secondary | ICD-10-CM | POA: Diagnosis not present

## 2017-09-05 DIAGNOSIS — M545 Low back pain: Secondary | ICD-10-CM | POA: Diagnosis not present

## 2017-09-05 DIAGNOSIS — Z23 Encounter for immunization: Secondary | ICD-10-CM | POA: Diagnosis not present

## 2017-09-10 DIAGNOSIS — M545 Low back pain: Secondary | ICD-10-CM | POA: Diagnosis not present

## 2017-10-02 DIAGNOSIS — M545 Low back pain: Secondary | ICD-10-CM | POA: Diagnosis not present

## 2017-11-01 ENCOUNTER — Other Ambulatory Visit: Payer: Self-pay | Admitting: Physician Assistant

## 2017-11-01 DIAGNOSIS — J449 Chronic obstructive pulmonary disease, unspecified: Secondary | ICD-10-CM | POA: Diagnosis not present

## 2017-11-01 DIAGNOSIS — I10 Essential (primary) hypertension: Secondary | ICD-10-CM | POA: Diagnosis not present

## 2017-11-01 DIAGNOSIS — E782 Mixed hyperlipidemia: Secondary | ICD-10-CM | POA: Diagnosis not present

## 2017-11-01 DIAGNOSIS — M159 Polyosteoarthritis, unspecified: Secondary | ICD-10-CM | POA: Diagnosis not present

## 2017-11-01 DIAGNOSIS — Z1231 Encounter for screening mammogram for malignant neoplasm of breast: Secondary | ICD-10-CM | POA: Diagnosis not present

## 2017-11-01 DIAGNOSIS — E119 Type 2 diabetes mellitus without complications: Secondary | ICD-10-CM | POA: Diagnosis not present

## 2017-11-29 ENCOUNTER — Ambulatory Visit
Admission: RE | Admit: 2017-11-29 | Discharge: 2017-11-29 | Disposition: A | Discharge status: Home / Self Care | Payer: Medicare HMO | Source: Ambulatory Visit | Attending: Physician Assistant | Admitting: Physician Assistant

## 2017-11-29 DIAGNOSIS — Z1231 Encounter for screening mammogram for malignant neoplasm of breast: Secondary | ICD-10-CM | POA: Insufficient documentation

## 2018-10-23 ENCOUNTER — Other Ambulatory Visit: Payer: Self-pay | Admitting: Physician Assistant

## 2018-10-23 DIAGNOSIS — Z1231 Encounter for screening mammogram for malignant neoplasm of breast: Secondary | ICD-10-CM

## 2018-12-03 ENCOUNTER — Ambulatory Visit
Admission: RE | Admit: 2018-12-03 | Discharge: 2018-12-03 | Disposition: A | Discharge status: Home / Self Care | Payer: Medicare HMO | Source: Ambulatory Visit | Attending: Physician Assistant | Admitting: Physician Assistant

## 2018-12-03 DIAGNOSIS — Z1231 Encounter for screening mammogram for malignant neoplasm of breast: Secondary | ICD-10-CM | POA: Insufficient documentation

## 2019-06-17 ENCOUNTER — Other Ambulatory Visit: Payer: Self-pay | Admitting: Neurology

## 2019-06-17 ENCOUNTER — Other Ambulatory Visit (HOSPITAL_COMMUNITY): Payer: Self-pay | Admitting: Neurology

## 2019-06-17 DIAGNOSIS — R4789 Other speech disturbances: Secondary | ICD-10-CM

## 2019-07-01 ENCOUNTER — Other Ambulatory Visit: Payer: Self-pay

## 2019-07-01 ENCOUNTER — Ambulatory Visit (HOSPITAL_COMMUNITY)
Admission: RE | Admit: 2019-07-01 | Discharge: 2019-07-01 | Disposition: A | Discharge status: Home / Self Care | Payer: Medicare HMO | Source: Ambulatory Visit | Attending: Neurology | Admitting: Neurology

## 2019-07-01 DIAGNOSIS — R4789 Other speech disturbances: Secondary | ICD-10-CM | POA: Diagnosis present

## 2019-07-22 ENCOUNTER — Ambulatory Visit: Payer: Medicare HMO | Attending: Neurology | Admitting: Speech Pathology

## 2019-07-22 ENCOUNTER — Other Ambulatory Visit: Payer: Self-pay

## 2019-07-22 DIAGNOSIS — R488 Other symbolic dysfunctions: Secondary | ICD-10-CM | POA: Insufficient documentation

## 2019-07-22 DIAGNOSIS — R41841 Cognitive communication deficit: Secondary | ICD-10-CM | POA: Insufficient documentation

## 2019-07-23 ENCOUNTER — Other Ambulatory Visit: Payer: Self-pay

## 2019-07-23 ENCOUNTER — Encounter: Payer: Self-pay | Admitting: Speech Pathology

## 2019-07-23 NOTE — Therapy (Signed)
[place] MAIN [facility] SERVICES [address] [place], [place], [address] Phone: [phone]   Fax:  [phone]  Speech Language Pathology Evaluation  Patient Details  Name: Carmen Gonzalez MRN: [id number] Date of Birth: 03-07-1951 Referring Provider (SLP): Dr. Manuella Ghazi   Encounter Date: 07/22/2019  End of Session - 07/23/19 1503    Visit Number  1    Number of Visits  17    Date for SLP Re-Evaluation  09/16/19    Authorization Type  Medicare    Authorization Time Period  Start 07/22/2019    Authorization - Visit Number  1    Authorization - Number of Visits  10    SLP Start Time  1000    SLP Stop Time   1050    SLP Time Calculation (min)  50 min    Activity Tolerance  Patient tolerated treatment well       Past Medical History:  Diagnosis Date  . Allergy   . Asthma   . COPD (chronic obstructive pulmonary disease) ([place])   . Hypercholesteremia   . Hyperlipidemia   . Hypertension   . Tuberculosis     Past Surgical History:  Procedure Laterality Date  . COLONOSCOPY WITH PROPOFOL N/A 01/17/2016   Procedure: COLONOSCOPY WITH PROPOFOL;  Surgeon: Manya Silvas, MD;  Location: [facility] ENDOSCOPY;  Service: Endoscopy;  Laterality: N/A;    There were no vitals filed for this visit.      SLP Evaluation [facility] - 07/23/19 0001      SLP Visit Information   SLP Received On  07/22/19    Referring Provider (SLP)  Dr. Manuella Ghazi    Onset Date  06/23/2019    Medical Diagnosis  Word finding difficulty      Subjective   Subjective  The patient was alert, pleasant, and cooperative throughout the speech therapy evaluation.     Patient/Family Stated Goal  To speak fluently      General Information   HPI  Carmen Gonzalez is a 68 year old woman with word finding difficulties since September 2019 that she associates with as particular medication for COPD/asthma.  An MRI (07/01/2019) was "unremarkable exam for age".   Patient does not report any prior  cognitive-linguistic challenges. She reports managing her own medication, finances, personal responsibilities, etc. independently.  The patient reports frequently experiencing word finding difficulties, which she describes as "not remembering" what she wanted to say mid-sentence, and mispronunciation of words. Although she does not believe her functional communication or activities of daily living are hindered, she feels "irritated" by these problems and would like to improve her ability to speak fluently.       Prior Functional Status   Cognitive/Linguistic Baseline  Within functional limits      Cognition   Overall Cognitive Status  Impaired/Different from baseline      Auditory Comprehension   Overall Auditory Comprehension  Appears within functional limits for tasks assessed      Reading Comprehension   Reading Status  Within funtional limits      Verbal Expression   Overall Verbal Expression  Impaired      Written Expression   Written Expression  Within Functional Limits      Oral Motor/Sensory Function   Overall Oral Motor/Sensory Function  Appears within functional limits for tasks assessed      Motor Speech   Overall Motor Speech  Appears within functional limits for tasks assessed      Standardized Assessments  Standardized Assessments   Montreal Cognitive Assessment (MOCA)        Montreal Cognitive Assessment [facility]) Version: 8.1 Visuospatial/Executive Alternating trail making       1/1 Visuoconstruction Skills (copy 3-d design) 1/1 Draw a clock     3/3 Naming     2/3 Attention Forward digit span    1/1 Backward digit span    0/1 Vigilance     1/1 Serial 7's     1/3 Language  Verbal Fluency     0/1 Repetition     1/2 Abstraction     2/2 Delayed Recall    4/5  Memory Index Score  14/15 Orientation     6/6 TOTAL      23/30       Normal  ? 26/30     SLP Education - 07/23/19 1502    Education Details  Results and recommendations of evaluation    Person(s)  Educated  Patient    Methods  Explanation    Comprehension  Verbalized understanding         SLP Long Term Goals - 07/23/19 1507      SLP LONG TERM GOAL #1   Title  Patient will complete high level word finding tasks with 80% accuracy.    Time  8    Period  Weeks    Status  New    Target Date  09/16/19      SLP LONG TERM GOAL #2   Title  Patient will generate grammatical, fluent, and cogent sentences to complete abstract/complex linguistic task with 80% accuracy.    Time  8    Period  Weeks    Status  New    Target Date  09/16/19      SLP LONG TERM GOAL #3   Title  Patient will read aloud paragraphs with 90% fluency.    Time  8    Period  Weeks    Status  New    Target Date  09/16/19      SLP LONG TERM GOAL #4   Title  Patient will complete complex immediate recall and attention tasks with 80% accuracy.    Time  8    Period  Weeks    Status  New    Target Date  09/16/19       Plan - 07/23/19 1505    Clinical Impression Statement  The patient is presenting with mild cognitive-communication impairments characterized by reduced information content and fluency of spontaneous speech, anomia, reduced immediate memory, reduced attention, reduced repetition skills, and reduced word fluency.  The patient reports that her reading fluency is "choppy" but that reading comprehension is not impaired.  The patient was able to recall 4 of 5 words (and the fifth given semantic cue) which indicates relatively intact short-term memory once information is encoded.  The results of an oral mechanism exam indicate that motor planning is not a problem. The patient will benefit from skilled speech therapy for restorative and compensatory treatment of anomia and on-going assessment of cognitive-communication needs.    Speech Therapy Frequency  2x / week    Duration  Other (comment)   8 weeks   Treatment/Interventions  Language facilitation;Patient/family education    Potential to Achieve Goals   Good    Potential Considerations  Ability to learn/carryover information;Previous level of function;Co-morbidities;Severity of impairments;Cooperation/participation level;Family/community support    SLP Home Exercise Plan  TBD    Consulted and Agree with Plan of Care  Patient  Patient will benefit from skilled therapeutic intervention in order to improve the following deficits and impairments:   Cognitive communication deficit - Plan: SLP plan of care cert/re-cert  Anomia - Plan: SLP plan of care cert/re-cert    Problem List There are no active problems to display for this patient.  Leroy Sea, MS/CCC- SLP  Lou Miner 07/23/2019, 3:13 PM  [place] MAIN [facility] SERVICES [address] [place], [place], [address] Phone: [phone]   Fax:  [phone]  Name: Carmen Gonzalez MRN: [id number] Date of Birth: 06-05-1951

## 2019-07-25 ENCOUNTER — Encounter: Payer: Medicare HMO | Admitting: Speech Pathology

## 2019-07-29 ENCOUNTER — Encounter: Payer: Self-pay | Admitting: Speech Pathology

## 2019-07-29 ENCOUNTER — Ambulatory Visit: Payer: Medicare HMO | Admitting: Speech Pathology

## 2019-07-29 ENCOUNTER — Other Ambulatory Visit: Payer: Self-pay

## 2019-07-29 DIAGNOSIS — R41841 Cognitive communication deficit: Secondary | ICD-10-CM

## 2019-07-29 NOTE — Therapy (Signed)
[place] MAIN [facility] SERVICES [address] [place], [place], [address] Phone: [phone]   Fax:  [phone]  Speech Language Pathology Treatment  Patient Details  Name: Carmen Gonzalez MRN: [id number] Date of Birth: Sep 10, 1951 Referring Provider (SLP): Dr. Manuella Ghazi   Encounter Date: 07/29/2019  End of Session - 07/29/19 1446    Visit Number  2    Number of Visits  17    Date for SLP Re-Evaluation  09/16/19    Authorization Type  Medicare    Authorization Time Period  Start 07/22/2019    Authorization - Visit Number  2    Authorization - Number of Visits  10    SLP Start Time  1100    SLP Stop Time   1150    SLP Time Calculation (min)  50 min    Activity Tolerance  Patient tolerated treatment well       Past Medical History:  Diagnosis Date  . Allergy   . Asthma   . COPD (chronic obstructive pulmonary disease) ([place])   . Hypercholesteremia   . Hyperlipidemia   . Hypertension   . Tuberculosis     Past Surgical History:  Procedure Laterality Date  . COLONOSCOPY WITH PROPOFOL N/A 01/17/2016   Procedure: COLONOSCOPY WITH PROPOFOL;  Surgeon: Manya Silvas, MD;  Location: [facility] ENDOSCOPY;  Service: Endoscopy;  Laterality: N/A;    There were no vitals filed for this visit.  Subjective Assessment - 07/29/19 1443    Subjective  The patient was alert, cooperative, and pleasant throughout the therapy session. She reported completing a lengthy neuropsychological evaluation last week and expects to receive the results at her next appointment on 08/08/19.        SLP Evaluation [facility] - 07/29/19 0001      General Information   HPI  Carmen Gonzalez is a 68 year old woman with word finding difficulties since September 2019 that she associates with as particular medication for COPD/asthma.  An MRI (07/01/2019) was "unremarkable exam for age".   Patient does not report any prior cognitive-linguistic challenges. She reports managing her own medication,  finances, personal responsibilities, etc. independently.  The patient reports frequently experiencing word finding difficulties, which she describes as "not remembering" what she wanted to say mid-sentence, and mispronunciation of words. Although she does not believe her functional communication or activities of daily living are hindered, she feels "irritated" by these problems and would like to improve her ability to speak fluently.          ADULT SLP TREATMENT - 07/29/19 0001      General Information   Behavior/Cognition  Alert;Cooperative;Pleasant mood      Treatment Provided   Treatment provided  Cognitive-Linquistic      Pain Assessment   Pain Assessment  No/denies pain      Cognitive-Linquistic Treatment   Skilled Treatment  Patient education was provided both verbally and in writing regarding systematic memory strategies for everyday functioning. During a concrete linguistic task, the patient generated grammatical, fluent, and cogent sentences with 75% accuracy independently. Given minimal cueing, accuracy increased to 85%. The patient completed a moderately complex immediate recall/auditory attention task with 71% accuracy without interventions. Given semantic cueing, accuracy increased to 100%. During a more complex immediate recall/auditory attention task, the patient achieved 80% accuracy independently. Given phonemic cueing, accuracy increased to 100%. Across therapy tasks, explicit instruction was provided pertaining to compensatory strategy use and the patient verbalized understanding.       Assessment /  Recommendations / Plan   Plan  Continue with current plan of care      Progression Toward Goals   Progression toward goals  Progressing toward goals       SLP Education - 07/29/19 1443    Education Details  Closing your eyes and visualizing the item can help encode it in your memory.    Person(s) Educated  Patient    Methods  Explanation    Comprehension  Verbalized  understanding         SLP Long Term Goals - 07/23/19 1507      SLP LONG TERM GOAL #1   Title  Patient will complete high level word finding tasks with 80% accuracy.    Time  8    Period  Weeks    Status  New    Target Date  09/16/19      SLP LONG TERM GOAL #2   Title  Patient will generate grammatical, fluent, and cogent sentences to complete abstract/complex linguistic task with 80% accuracy.    Time  8    Period  Weeks    Status  New    Target Date  09/16/19      SLP LONG TERM GOAL #3   Title  Patient will read aloud paragraphs with 90% fluency.    Time  8    Period  Weeks    Status  New    Target Date  09/16/19      SLP LONG TERM GOAL #4   Title  Patient will complete complex immediate recall and attention tasks with 80% accuracy.    Time  8    Period  Weeks    Status  New    Target Date  09/16/19       Plan - 07/29/19 1447    Clinical Impression Statement  The patient presents with mild cognitive-communication deficits characterized by reduced content and fluency in spontaneous speech, word finding difficulties, and reduced attention and immediate recall. She is frustrated by these challenges and exhibits motivation to learn compensatory strategies for improving her cognitive-communication abilities.    Speech Therapy Frequency  2x / week    Duration  Other (comment)    Treatment/Interventions  Language facilitation;Patient/family education;Compensatory strategies;SLP instruction and feedback    Potential to Achieve Goals  Good    Potential Considerations  Ability to learn/carryover information;Previous level of function;Co-morbidities;Severity of impairments;Cooperation/participation level;Family/community support    SLP Home Exercise Plan  Provided    Consulted and Agree with Plan of Care  Patient       Patient will benefit from skilled therapeutic intervention in order to improve the following deficits and impairments:   Cognitive communication  deficit    Problem List There are no active problems to display for this patient.  Carmen Gonzalez A. Francis Dowse., Graduate Clinician Vella Kohler 07/29/2019, 2:48 PM  [place] MAIN [facility] SERVICES [address] [place], [place], [address] Phone: [phone]   Fax:  [phone]   Name: Carmen Gonzalez MRN: [id number] Date of Birth: 1951-06-07

## 2019-08-01 ENCOUNTER — Encounter: Payer: Self-pay | Admitting: Speech Pathology

## 2019-08-01 ENCOUNTER — Ambulatory Visit: Payer: Medicare HMO | Admitting: Speech Pathology

## 2019-08-01 ENCOUNTER — Other Ambulatory Visit: Payer: Self-pay

## 2019-08-01 DIAGNOSIS — R41841 Cognitive communication deficit: Secondary | ICD-10-CM

## 2019-08-01 DIAGNOSIS — R488 Other symbolic dysfunctions: Secondary | ICD-10-CM

## 2019-08-01 NOTE — Therapy (Signed)
[place] MAIN [facility] SERVICES [address] [place], [place], [address] Phone: [phone]   Fax:  [phone]  Speech Language Pathology Treatment  Patient Details  Name: Carmen Gonzalez MRN: [id number] Date of Birth: 08/14/1951 Referring Provider (SLP): Dr. Manuella Ghazi   Encounter Date: 08/01/2019  End of Session - 08/01/19 1341    Visit Number  3    Number of Visits  17    Date for SLP Re-Evaluation  09/16/19    Authorization Type  Medicare    Authorization Time Period  Start 07/22/2019    Authorization - Visit Number  3    Authorization - Number of Visits  10    SLP Start Time  1000    SLP Stop Time   1045    SLP Time Calculation (min)  45 min    Activity Tolerance  Patient tolerated treatment well       Past Medical History:  Diagnosis Date  . Allergy   . Asthma   . COPD (chronic obstructive pulmonary disease) ([place])   . Hypercholesteremia   . Hyperlipidemia   . Hypertension   . Tuberculosis     Past Surgical History:  Procedure Laterality Date  . COLONOSCOPY WITH PROPOFOL N/A 01/17/2016   Procedure: COLONOSCOPY WITH PROPOFOL;  Surgeon: Manya Silvas, MD;  Location: [facility] ENDOSCOPY;  Service: Endoscopy;  Laterality: N/A;    There were no vitals filed for this visit.  Subjective Assessment - 08/01/19 1339    Subjective  The patient reports difficulty talking with family members recently        SLP Evaluation [facility] - 08/01/19 0001      General Information   HPI  Carmen Gonzalez is a 68 year old woman with word finding difficulties since September 2019 that she associates with as particular medication for COPD/asthma.  An MRI (07/01/2019) was "unremarkable exam for age".   Patient does not report any prior cognitive-linguistic challenges. She reports managing her own medication, finances, personal responsibilities, etc. independently.  The patient reports frequently experiencing word finding difficulties, which she describes as "not  remembering" what she wanted to say mid-sentence, and mispronunciation of words. Although she does not believe her functional communication or activities of daily living are hindered, she feels "irritated" by these problems and would like to improve her ability to speak fluently.          ADULT SLP TREATMENT - 08/01/19 0001      General Information   Behavior/Cognition  Alert;Cooperative;Pleasant mood    HPI  Carmen Gonzalez is a 68 year old woman with word finding difficulties since September 2019 that she associates with as particular medication for COPD/asthma.  An MRI (07/01/2019) was "unremarkable exam for age".   Patient does not report any prior cognitive-linguistic challenges. She reports managing her own medication, finances, personal responsibilities, etc. independently.  The patient reports frequently experiencing word finding difficulties, which she describes as "not remembering" what she wanted to say mid-sentence, and mispronunciation of words. Although she does not believe her functional communication or activities of daily living are hindered, she feels "irritated" by these problems and would like to improve her ability to speak fluently.       Treatment Provided   Treatment provided  Cognitive-Linquistic      Pain Assessment   Pain Assessment  No/denies pain      Cognitive-Linquistic Treatment   Treatment focused on  Aphasia;Cognition    Skilled Treatment  WORD FINDING: Name concrete category given 3 members  with 100% accuracy and no hesitations.  Name abstract category given 3 members with 75% accuracy.  Demonstrate knowledge of words with multiple meanings, but has difficulty giving meaningful definition using words with content.  Completes simple verbal analogies with 100% accuracy but cannot state analogous relationship.  MEMORY: Given a few minutes to memorize a complex picture, answers questions about the picture with 100% accuracy.      Assessment / Recommendations / Plan    Plan  Continue with current plan of care      Progression Toward Goals   Progression toward goals  Progressing toward goals       SLP Education - 08/01/19 1341    Education Details  Word finding strategies    Person(s) Educated  Patient    Methods  Explanation    Comprehension  Verbalized understanding         SLP Long Term Goals - 07/23/19 1507      SLP LONG TERM GOAL #1   Title  Patient will complete high level word finding tasks with 80% accuracy.    Time  8    Period  Weeks    Status  New    Target Date  09/16/19      SLP LONG TERM GOAL #2   Title  Patient will generate grammatical, fluent, and cogent sentences to complete abstract/complex linguistic task with 80% accuracy.    Time  8    Period  Weeks    Status  New    Target Date  09/16/19      SLP LONG TERM GOAL #3   Title  Patient will read aloud paragraphs with 90% fluency.    Time  8    Period  Weeks    Status  New    Target Date  09/16/19      SLP LONG TERM GOAL #4   Title  Patient will complete complex immediate recall and attention tasks with 80% accuracy.    Time  8    Period  Weeks    Status  New    Target Date  09/16/19       Plan - 08/01/19 1342    Clinical Impression Statement  The patient is demonstrating difficulty expressing abstract/complex thoughts.  She has good visual memory.  Will continue ST to focus on abstract language and memory.    Speech Therapy Frequency  2x / week    Duration  Other (comment)    Treatment/Interventions  Language facilitation;Patient/family education;Compensatory strategies;SLP instruction and feedback    Potential to Achieve Goals  Good    Potential Considerations  Ability to learn/carryover information;Previous level of function;Co-morbidities;Severity of impairments;Cooperation/participation level;Family/community support    Consulted and Agree with Plan of Care  Patient       Patient will benefit from skilled therapeutic intervention in order to  improve the following deficits and impairments:   Cognitive communication deficit  Anomia    Problem List There are no active problems to display for this patient.  Carmen Sea, MS/CCC- SLP  Carmen Gonzalez 08/01/2019, 1:43 PM  [place] MAIN [facility] SERVICES [address] [place], [place], [address] Phone: [phone]   Fax:  [phone]   Name: Carmen Gonzalez MRN: [id number] Date of Birth: 1951/06/19

## 2019-08-04 ENCOUNTER — Other Ambulatory Visit: Payer: Self-pay

## 2019-08-04 ENCOUNTER — Encounter: Payer: Self-pay | Admitting: Speech Pathology

## 2019-08-04 ENCOUNTER — Ambulatory Visit: Payer: Medicare HMO | Admitting: Speech Pathology

## 2019-08-04 DIAGNOSIS — R41841 Cognitive communication deficit: Secondary | ICD-10-CM | POA: Diagnosis not present

## 2019-08-04 NOTE — Therapy (Signed)
[place] MAIN [facility] SERVICES [address] [place], [place], [address] Phone: [phone]   Fax:  [phone]  Speech Language Pathology Treatment  Patient Details  Name: Carmen Gonzalez MRN: [id number] Date of Birth: Jul 08, 1951 Referring Provider (SLP): Dr. Manuella Ghazi   Encounter Date: 08/04/2019  End of Session - 08/04/19 1227    Visit Number  4    Number of Visits  17    Date for SLP Re-Evaluation  09/16/19    Authorization Type  Medicare    Authorization Time Period  Start 07/22/2019    Authorization - Visit Number  4    Authorization - Number of Visits  10    SLP Start Time  0850    SLP Stop Time   0940    SLP Time Calculation (min)  50 min       Past Medical History:  Diagnosis Date  . Allergy   . Asthma   . COPD (chronic obstructive pulmonary disease) ([place])   . Hypercholesteremia   . Hyperlipidemia   . Hypertension   . Tuberculosis     Past Surgical History:  Procedure Laterality Date  . COLONOSCOPY WITH PROPOFOL N/A 01/17/2016   Procedure: COLONOSCOPY WITH PROPOFOL;  Surgeon: Manya Silvas, MD;  Location: [facility] ENDOSCOPY;  Service: Endoscopy;  Laterality: N/A;    There were no vitals filed for this visit.  Subjective Assessment - 08/04/19 1225    Subjective  The patient was alert, cooperative, and pleasant throughout the therapy session. She reported having a busy weekend.            ADULT SLP TREATMENT - 08/04/19 0001      General Information   Behavior/Cognition  Alert;Cooperative;Pleasant mood    HPI  Carmen Gonzalez is a 68 year old woman with word finding difficulties since September 2019 that she associates with as particular medication for COPD/asthma.  An MRI (07/01/2019) was "unremarkable exam for age".   Patient does not report any prior cognitive-linguistic challenges. She reports managing her own medication, finances, personal responsibilities, etc. independently.  The patient reports frequently  experiencing word finding difficulties, which she describes as "not remembering" what she wanted to say mid-sentence, and mispronunciation of words. Although she does not believe her functional communication or activities of daily living are hindered, she feels "irritated" by these problems and would like to improve her ability to speak fluently.       Treatment Provided   Treatment provided  Cognitive-Linquistic      Pain Assessment   Pain Assessment  No/denies pain      Cognitive-Linquistic Treatment   Treatment focused on  Cognition;Aphasia    Skilled Treatment  During a concrete linguistic task, the patient generated grammatical, fluent, and cogent sentences with 75% accuracy independently (reduced content noted in patient's responses). Given minimal cueing, accuracy increased to 90%. The patient completed an abstract linguistic task with 90% accuracy, given minimal assistance, but had difficulty verbalizing the analogous relationships between pairs of words. During a high level word finding task, the patient achieved 60% accuracy independently. Given semantic cueing, accuracy increased to 75%. The patient completed a complex immediate recall/auditory attention task with 95% accuracy without interventions. Given semantic/phonemic cueing, accuracy increased to 100%. The patient read paragraphs aloud with 70% fluency without interventions. Given explicit instruction in coordination of breath support for speech and strategic pausing, fluency increased to 85%.       Assessment / Recommendations / Plan   Plan  Continue with current  plan of care      Progression Toward Goals   Progression toward goals  Progressing toward goals       SLP Education - 08/04/19 1225    Education Details  Read the information silently to yourself before reading it out loud.    Person(s) Educated  Patient    Methods  Explanation    Comprehension  Verbalized understanding         SLP Long Term Goals - 07/23/19  1507      SLP LONG TERM GOAL #1   Title  Patient will complete high level word finding tasks with 80% accuracy.    Time  8    Period  Weeks    Status  New    Target Date  09/16/19      SLP LONG TERM GOAL #2   Title  Patient will generate grammatical, fluent, and cogent sentences to complete abstract/complex linguistic task with 80% accuracy.    Time  8    Period  Weeks    Status  New    Target Date  09/16/19      SLP LONG TERM GOAL #3   Title  Patient will read aloud paragraphs with 90% fluency.    Time  8    Period  Weeks    Status  New    Target Date  09/16/19      SLP LONG TERM GOAL #4   Title  Patient will complete complex immediate recall and attention tasks with 80% accuracy.    Time  8    Period  Weeks    Status  New    Target Date  09/16/19       Plan - 08/04/19 1227    Clinical Impression Statement  The patient presents with mild cognitive-communication deficits characterized by reduced content and fluency in spontaneous speech, word finding difficulties, and reduced attention and immediate recall. She exhibits frustration and impatience with herself during moments of word finding and recall difficulties, but she indicates understanding that succumbing to these emotions works against her ability to overcome these communication challenges. The patient is motivated to learn compensatory strategies for improving her cognitive-communication skills.    Speech Therapy Frequency  2x / week    Duration  Other (comment)    Treatment/Interventions  Language facilitation;Patient/family education;Compensatory strategies;SLP instruction and feedback    Potential to Achieve Goals  Good    Potential Considerations  Ability to learn/carryover information;Previous level of function;Co-morbidities;Severity of impairments;Cooperation/participation level;Family/community support    SLP Home Exercise Plan  Provided    Consulted and Agree with Plan of Care  Patient       Patient will  benefit from skilled therapeutic intervention in order to improve the following deficits and impairments:   Cognitive communication deficit    Problem List There are no active problems to display for this patient.  Carmen Gonzalez A. Francis Dowse., Graduate Clinician Carmen Gonzalez 08/04/2019, 12:28 PM  [place] MAIN [facility] SERVICES [address] [place], [place], [address] Phone: [phone]   Fax:  [phone]   Name: Carmen Gonzalez MRN: [id number] Date of Birth: December 27, 1950

## 2019-08-07 ENCOUNTER — Ambulatory Visit: Payer: Medicare HMO | Admitting: Speech Pathology

## 2019-08-07 ENCOUNTER — Encounter: Payer: Self-pay | Admitting: Speech Pathology

## 2019-08-07 ENCOUNTER — Other Ambulatory Visit: Payer: Self-pay

## 2019-08-07 DIAGNOSIS — R41841 Cognitive communication deficit: Secondary | ICD-10-CM

## 2019-08-07 NOTE — Therapy (Signed)
[place] MAIN [facility] SERVICES [address] [place], [place], [address] Phone: [phone]   Fax:  [phone]  Speech Language Pathology Treatment  Patient Details  Name: Carmen Gonzalez MRN: [id number] Date of Birth: November 06, 1951 Referring Provider (SLP): Dr. Manuella Ghazi   Encounter Date: 08/07/2019  End of Session - 08/07/19 1236    Visit Number  5    Number of Visits  17    Date for SLP Re-Evaluation  09/16/19    Authorization Type  Medicare    Authorization Time Period  Start 07/22/2019    Authorization - Visit Number  5    Authorization - Number of Visits  10    SLP Start Time  [id number]    SLP Stop Time   1050    SLP Time Calculation (min)  55 min    Activity Tolerance  Patient tolerated treatment well       Past Medical History:  Diagnosis Date  . Allergy   . Asthma   . COPD (chronic obstructive pulmonary disease) ([place])   . Hypercholesteremia   . Hyperlipidemia   . Hypertension   . Tuberculosis     Past Surgical History:  Procedure Laterality Date  . COLONOSCOPY WITH PROPOFOL N/A 01/17/2016   Procedure: COLONOSCOPY WITH PROPOFOL;  Surgeon: Manya Silvas, MD;  Location: [facility] ENDOSCOPY;  Service: Endoscopy;  Laterality: N/A;    There were no vitals filed for this visit.  Subjective Assessment - 08/07/19 1234    Subjective  The patient was alert, cooperative, and pleasant throughout the therapy session. She shared about preparations for her family's upcoming beach vacation.            ADULT SLP TREATMENT - 08/07/19 0001      General Information   Behavior/Cognition  Alert;Cooperative;Pleasant mood    HPI  Carmen Gonzalez is a 68 year old woman with word finding difficulties since September 2019 that she associates with as particular medication for COPD/asthma.  An MRI (07/01/2019) was "unremarkable exam for age".   Patient does not report any prior cognitive-linguistic challenges. She reports managing her own medication,  finances, personal responsibilities, etc. independently.  The patient reports frequently experiencing word finding difficulties, which she describes as "not remembering" what she wanted to say mid-sentence, and mispronunciation of words. Although she does not believe her functional communication or activities of daily living are hindered, she feels "irritated" by these problems and would like to improve her ability to speak fluently.       Treatment Provided   Treatment provided  Cognitive-Linquistic      Pain Assessment   Pain Assessment  No/denies pain      Cognitive-Linquistic Treatment   Treatment focused on  Cognition;Aphasia    Skilled Treatment  During a concrete linguistic task, the patient generated grammatical, fluent, and cogent sentences with 80% accuracy independently (reduced content noted in patient's responses). Given minimal cueing, accuracy increased to 100%. The patient completed an abstract linguistic task with 76% accuracy. She was noted with word finding difficulty when prompted to provide more specific terms during this exercise. During a high level word finding task, the patient achieved 85% accuracy independently (delayed response times noted). Patient education was provided regarding semantic feature analysis to facilitate communication during moments of word finding difficulty, and the patient verbalized understanding. The patient completed a complex immediate recall/auditory attention task with 70% accuracy without interventions. Given semantic/phonemic cueing, accuracy increased to 95%. The patient read paragraphs aloud with 90% fluency, given  minimal cues to purposefully attend to coordination of breath support for speech and use strategic pausing.       Assessment / Recommendations / Plan   Plan  Continue with current plan of care      Progression Toward Goals   Progression toward goals  Progressing toward goals       SLP Education - 08/07/19 1234    Education  Details  Using related words that describe various properties of the object, what it's used for, where one might see it, etc. can help in moments of word retrieval difficulty.    Person(s) Educated  Patient    Methods  Explanation;Demonstration    Comprehension  Verbalized understanding         SLP Long Term Goals - 07/23/19 1507      SLP LONG TERM GOAL #1   Title  Patient will complete high level word finding tasks with 80% accuracy.    Time  8    Period  Weeks    Status  New    Target Date  09/16/19      SLP LONG TERM GOAL #2   Title  Patient will generate grammatical, fluent, and cogent sentences to complete abstract/complex linguistic task with 80% accuracy.    Time  8    Period  Weeks    Status  New    Target Date  09/16/19      SLP LONG TERM GOAL #3   Title  Patient will read aloud paragraphs with 90% fluency.    Time  8    Period  Weeks    Status  New    Target Date  09/16/19      SLP LONG TERM GOAL #4   Title  Patient will complete complex immediate recall and attention tasks with 80% accuracy.    Time  8    Period  Weeks    Status  New    Target Date  09/16/19       Plan - 08/07/19 1236    Clinical Impression Statement  The patient presents with mild cognitive-communication deficits characterized by reduced content and fluency in spontaneous speech, word finding difficulties, and reduced attention and immediate recall. She demonstrates guarded progress with decreasing her frustration and impatience with herself during moments of word finding and recall difficulties. She is motivated to learn compensatory strategies for improving her cognitive-communication skills is making good progress with implementing them during structured linguistic tasks in the therapy setting.    Speech Therapy Frequency  2x / week    Duration  Other (comment)    Treatment/Interventions  Language facilitation;Patient/family education;Compensatory strategies;SLP instruction and feedback     Potential to Achieve Goals  Good    Potential Considerations  Ability to learn/carryover information;Previous level of function;Co-morbidities;Severity of impairments;Cooperation/participation level;Family/community support    SLP Home Exercise Plan  Provided    Consulted and Agree with Plan of Care  Patient       Patient will benefit from skilled therapeutic intervention in order to improve the following deficits and impairments:   Cognitive communication deficit    Problem List There are no active problems to display for this patient.  Carmen Gonzalez A. Francis Dowse., Graduate Clinician Vella Kohler 08/07/2019, 12:37 PM  [place] MAIN [facility] SERVICES [address] [place], [place], [address] Phone: [phone]   Fax:  [phone]   Name: Carmen Gonzalez MRN: [id number] Date of Birth: [birth year]

## 2019-08-11 ENCOUNTER — Encounter: Payer: Medicare HMO | Admitting: Speech Pathology

## 2019-08-14 ENCOUNTER — Encounter: Payer: Medicare HMO | Admitting: Speech Pathology

## 2019-08-18 ENCOUNTER — Encounter: Payer: Medicare HMO | Admitting: Speech Pathology

## 2019-08-20 ENCOUNTER — Other Ambulatory Visit: Payer: Self-pay

## 2019-08-20 ENCOUNTER — Encounter: Payer: Self-pay | Admitting: Speech Pathology

## 2019-08-20 ENCOUNTER — Ambulatory Visit: Payer: Medicare HMO | Admitting: Speech Pathology

## 2019-08-20 DIAGNOSIS — R488 Other symbolic dysfunctions: Secondary | ICD-10-CM

## 2019-08-20 DIAGNOSIS — R41841 Cognitive communication deficit: Secondary | ICD-10-CM

## 2019-08-20 NOTE — Therapy (Signed)
[place] MAIN [facility] SERVICES [address] [place], [place], [address] Phone: [phone]   Fax:  [phone]  Speech Language Pathology Treatment    The information in this patient note, response to treatment, and overall treatment plan developed has been reviewed and agreed upon by this clinician.  Orinda Kenner, [place], [place] [phone] 08/20/19,4:05 PM    Patient Details  Name: Carmen Gonzalez MRN: [id number] Date of Birth: 1951/06/17 Referring Provider (SLP): Dr. Manuella Ghazi   Encounter Date: 08/20/2019  End of Session - 08/20/19 1025    Visit Number  6    Number of Visits  17    Date for SLP Re-Evaluation  09/16/19    Authorization Type  Medicare    Authorization Time Period  Start 07/22/2019    Authorization - Visit Number  6    Authorization - Number of Visits  10    SLP Start Time  0900    SLP Stop Time   0950    SLP Time Calculation (min)  50 min    Activity Tolerance  Patient tolerated treatment well       Past Medical History:  Diagnosis Date  . Allergy   . Asthma   . COPD (chronic obstructive pulmonary disease) ([place])   . Hypercholesteremia   . Hyperlipidemia   . Hypertension   . Tuberculosis     Past Surgical History:  Procedure Laterality Date  . COLONOSCOPY WITH PROPOFOL N/A 01/17/2016   Procedure: COLONOSCOPY WITH PROPOFOL;  Surgeon: Manya Silvas, MD;  Location: [facility] ENDOSCOPY;  Service: Endoscopy;  Laterality: N/A;    There were no vitals filed for this visit.  Subjective Assessment - 08/20/19 1023    Subjective  The patient was alert, cooperative, and pleasant throughout the therapy session. She reported that she and her family enjoyed a wonderful vacation at the beach last week.            ADULT SLP TREATMENT - 08/20/19 0001      General Information   Behavior/Cognition  Alert;Cooperative;Pleasant mood    HPI  Carmen Gonzalez is a 68 year old woman with word finding difficulties since  September 2019 that she associates with as particular medication for COPD/asthma.  An MRI (07/01/2019) was "unremarkable exam for age".   Patient does not report any prior cognitive-linguistic challenges. She reports managing her own medication, finances, personal responsibilities, etc. independently.  The patient reports frequently experiencing word finding difficulties, which she describes as "not remembering" what she wanted to say mid-sentence, and mispronunciation of words. Although she does not believe her functional communication or activities of daily living are hindered, she feels "irritated" by these problems and would like to improve her ability to speak fluently.       Treatment Provided   Treatment provided  Cognitive-Linquistic      Pain Assessment   Pain Assessment  No/denies pain      Cognitive-Linquistic Treatment   Treatment focused on  Cognition;Aphasia    Skilled Treatment  The patient generated fluent, cogent, grammatically correct sentences during completion of an abstract linguistic task with 70% accuracy independently. Given minimal cueing, accuracy improved to 76%. During a high level word finding task, the patient achieved 80% accuracy independently (with delayed response times. Given semantic cueing, accuracy increased to 88%. Patient education regarding semantic feature analysis to facilitate communication during moments of word finding difficulty provided during the most recent therapy session was reinforced and practiced. Patient able to expand list of associated/related words  given minimal-moderate cueing. The patient completed a complex immediate recall/auditory attention task with 85% accuracy without interventions. Given semantic cueing and 3 repetitions, accuracy increased to 100%. The patient read a paragraph aloud with 80% fluency on the first attempt. Given minimal cueing to purposefully attend to coordination of breath support for speech and use strategic pausing,  fluency improved to 90% on the second attempt.       Assessment / Recommendations / Plan   Plan  Continue with current plan of care      Progression Toward Goals   Progression toward goals  Progressing toward goals       SLP Education - 08/20/19 1023    Education Details  Visualize each agent and action as you hear the word pairs to help you remember them later on as we build the list.    Person(s) Educated  Patient    Methods  Explanation    Comprehension  Verbalized understanding         SLP Long Term Goals - 07/23/19 1507      SLP LONG TERM GOAL #1   Title  Patient will complete high level word finding tasks with 80% accuracy.    Time  8    Period  Weeks    Status  New    Target Date  09/16/19      SLP LONG TERM GOAL #2   Title  Patient will generate grammatical, fluent, and cogent sentences to complete abstract/complex linguistic task with 80% accuracy.    Time  8    Period  Weeks    Status  New    Target Date  09/16/19      SLP LONG TERM GOAL #3   Title  Patient will read aloud paragraphs with 90% fluency.    Time  8    Period  Weeks    Status  New    Target Date  09/16/19      SLP LONG TERM GOAL #4   Title  Patient will complete complex immediate recall and attention tasks with 80% accuracy.    Time  8    Period  Weeks    Status  New    Target Date  09/16/19       Plan - 08/20/19 1026    Clinical Impression Statement  The patient presents with mild cognitive-communication deficits characterized by reduced content and fluency during spontaneous speech, word finding difficulties, and reduced attention and immediate recall. She is making progress with decreasing her frustration and impatience with herself during moments of word finding and recall difficulties within the structured therapy context. However, she expresses frustration surrounding her ongoing challenges with "choppy" speech in her home environment with her family. She is motivated to learn  compensatory strategies for improving her cognitive-communication skills and exhibits progress with implementing them during structured linguistic tasks. Patient would benefit from increased generalization of compensatory strategy use outside of the therapy setting.    Speech Therapy Frequency  2x / week    Duration  Other (comment)    Treatment/Interventions  Language facilitation;Patient/family education;Compensatory strategies;SLP instruction and feedback    Potential to Achieve Goals  Good    Potential Considerations  Ability to learn/carryover information;Previous level of function;Co-morbidities;Severity of impairments;Cooperation/participation level;Family/community support    SLP Home Exercise Plan  Provided    Consulted and Agree with Plan of Care  Patient       Patient will benefit from skilled therapeutic intervention in order to improve the following  deficits and impairments:   Cognitive communication deficit  [place]    Problem List There are no active problems to display for this patient.   , 08/20/2019, 4:04 PM  [place] MAIN [facility] SERVICES [address] [place], [place], [address] Phone: [phone]   Fax:  [phone]   Name: KADI SATTERTHWAITE MRN: [id number] Date of Birth: [birth year]

## 2019-08-20 NOTE — Therapy (Signed)
[place] MAIN [facility] SERVICES [address] [place], [place], [address] Phone: [phone]   Fax:  [phone]  Speech Language Pathology Treatment  Patient Details  Name: Carmen Gonzalez MRN: [id number] Date of Birth: 1951/05/16 Referring Provider (SLP): Dr. Manuella Ghazi   Encounter Date: 08/20/2019  End of Session - 08/20/19 1025    Visit Number  6    Number of Visits  17    Date for SLP Re-Evaluation  09/16/19    Authorization Type  Medicare    Authorization Time Period  Start 07/22/2019    Authorization - Visit Number  6    Authorization - Number of Visits  10    SLP Start Time  0900    SLP Stop Time   0950    SLP Time Calculation (min)  50 min    Activity Tolerance  Patient tolerated treatment well       Past Medical History:  Diagnosis Date  . Allergy   . Asthma   . COPD (chronic obstructive pulmonary disease) ([place])   . Hypercholesteremia   . Hyperlipidemia   . Hypertension   . Tuberculosis     Past Surgical History:  Procedure Laterality Date  . COLONOSCOPY WITH PROPOFOL N/A 01/17/2016   Procedure: COLONOSCOPY WITH PROPOFOL;  Surgeon: Manya Silvas, MD;  Location: [facility] ENDOSCOPY;  Service: Endoscopy;  Laterality: N/A;    There were no vitals filed for this visit.  Subjective Assessment - 08/20/19 1023    Subjective  The patient was alert, cooperative, and pleasant throughout the therapy session. She reported that she and her family enjoyed a wonderful vacation at the beach last week.            ADULT SLP TREATMENT - 08/20/19 0001      General Information   Behavior/Cognition  Alert;Cooperative;Pleasant mood    HPI  Carmen Gonzalez is a 68 year old woman with word finding difficulties since September 2019 that she associates with as particular medication for COPD/asthma.  An MRI (07/01/2019) was "unremarkable exam for age".   Patient does not report any prior cognitive-linguistic challenges. She reports managing her own  medication, finances, personal responsibilities, etc. independently.  The patient reports frequently experiencing word finding difficulties, which she describes as "not remembering" what she wanted to say mid-sentence, and mispronunciation of words. Although she does not believe her functional communication or activities of daily living are hindered, she feels "irritated" by these problems and would like to improve her ability to speak fluently.       Treatment Provided   Treatment provided  Cognitive-Linquistic      Pain Assessment   Pain Assessment  No/denies pain      Cognitive-Linquistic Treatment   Treatment focused on  Cognition;Aphasia    Skilled Treatment  The patient generated fluent, cogent, grammatically correct sentences during completion of an abstract linguistic task with 70% accuracy independently. Given minimal cueing, accuracy improved to 76%. During a high level word finding task, the patient achieved 80% accuracy independently (with delayed response times. Given semantic cueing, accuracy increased to 88%. Patient education regarding semantic feature analysis to facilitate communication during moments of word finding difficulty provided during the most recent therapy session was reinforced and practiced. Patient able to expand list of associated/related words given minimal-moderate cueing. The patient completed a complex immediate recall/auditory attention task with 85% accuracy without interventions. Given semantic cueing and 3 repetitions, accuracy increased to 100%. The patient read a paragraph aloud with 80% fluency  on the first attempt. Given minimal cueing to purposefully attend to coordination of breath support for speech and use strategic pausing, fluency improved to 90% on the second attempt.       Assessment / Recommendations / Plan   Plan  Continue with current plan of care      Progression Toward Goals   Progression toward goals  Progressing toward goals       SLP  Education - 08/20/19 1023    Education Details  Visualize each agent and action as you hear the word pairs to help you remember them later on as we build the list.    Person(s) Educated  Patient    Methods  Explanation    Comprehension  Verbalized understanding         SLP Long Term Goals - 07/23/19 1507      SLP LONG TERM GOAL #1   Title  Patient will complete high level word finding tasks with 80% accuracy.    Time  8    Period  Weeks    Status  New    Target Date  09/16/19      SLP LONG TERM GOAL #2   Title  Patient will generate grammatical, fluent, and cogent sentences to complete abstract/complex linguistic task with 80% accuracy.    Time  8    Period  Weeks    Status  New    Target Date  09/16/19      SLP LONG TERM GOAL #3   Title  Patient will read aloud paragraphs with 90% fluency.    Time  8    Period  Weeks    Status  New    Target Date  09/16/19      SLP LONG TERM GOAL #4   Title  Patient will complete complex immediate recall and attention tasks with 80% accuracy.    Time  8    Period  Weeks    Status  New    Target Date  09/16/19       Plan - 08/20/19 1026    Clinical Impression Statement  The patient presents with mild cognitive-communication deficits characterized by reduced content and fluency during spontaneous speech, word finding difficulties, and reduced attention and immediate recall. She is making progress with decreasing her frustration and impatience with herself during moments of word finding and recall difficulties within the structured therapy context. However, she expresses frustration surrounding her ongoing challenges with "choppy" speech in her home environment with her family. She is motivated to learn compensatory strategies for improving her cognitive-communication skills and exhibits progress with implementing them during structured linguistic tasks. Patient would benefit from increased generalization of compensatory strategy use outside  of the therapy setting.    Speech Therapy Frequency  2x / week    Duration  Other (comment)    Treatment/Interventions  Language facilitation;Patient/family education;Compensatory strategies;SLP instruction and feedback    Potential to Achieve Goals  Good    Potential Considerations  Ability to learn/carryover information;Previous level of function;Co-morbidities;Severity of impairments;Cooperation/participation level;Family/community support    SLP Home Exercise Plan  Provided    Consulted and Agree with Plan of Care  Patient       Patient will benefit from skilled therapeutic intervention in order to improve the following deficits and impairments:   Cognitive communication deficit  Anomia    Problem List There are no active problems to display for this patient.  Carmen Gonzalez., Graduate Clinician Vella Kohler 08/20/2019, 10:27  AM  [place] MAIN [facility] SERVICES [address] [place], [place], [address] Phone: [phone]   Fax:  [phone]   Name: Carmen Gonzalez MRN: [id number] Date of Birth: [birth year]

## 2019-08-25 ENCOUNTER — Encounter: Payer: Self-pay | Admitting: Speech Pathology

## 2019-08-25 ENCOUNTER — Other Ambulatory Visit: Payer: Self-pay

## 2019-08-25 ENCOUNTER — Ambulatory Visit: Payer: Medicare HMO | Attending: Neurology | Admitting: Speech Pathology

## 2019-08-25 DIAGNOSIS — R488 Other symbolic dysfunctions: Secondary | ICD-10-CM | POA: Insufficient documentation

## 2019-08-25 DIAGNOSIS — R41841 Cognitive communication deficit: Secondary | ICD-10-CM | POA: Diagnosis present

## 2019-08-25 NOTE — Therapy (Signed)
[place] MAIN [facility] SERVICES [address] [place], [place], [address] Phone: [phone]   Fax:  [phone]  Speech Language Pathology Treatment  Patient Details  Name: Carmen Gonzalez MRN: [id number] Date of Birth: 10/12/1951 Referring Provider (SLP): Dr. Manuella Ghazi   Encounter Date: 08/25/2019  End of Session - 08/25/19 1216    Visit Number  7    Number of Visits  17    Date for SLP Re-Evaluation  09/16/19    Authorization Type  Medicare    Authorization Time Period  Start 07/22/2019    Authorization - Visit Number  7    Authorization - Number of Visits  10    SLP Start Time  0900    SLP Stop Time   0950    SLP Time Calculation (min)  50 min    Activity Tolerance  Patient tolerated treatment well       Past Medical History:  Diagnosis Date  . Allergy   . Asthma   . COPD (chronic obstructive pulmonary disease) ([place])   . Hypercholesteremia   . Hyperlipidemia   . Hypertension   . Tuberculosis     Past Surgical History:  Procedure Laterality Date  . COLONOSCOPY WITH PROPOFOL N/A 01/17/2016   Procedure: COLONOSCOPY WITH PROPOFOL;  Surgeon: Manya Silvas, MD;  Location: [facility]COPY;  Service: Endoscopy;  Laterality: N/A;    There were no vitals filed for this visit.  Subjective Assessment - 08/25/19 1214    Subjective  The patient was alert, cooperative, and pleasant throughout the therapy session. She shared that she is currently helping her daughter move and discussed her family's plans for a trip to [place] in December.            ADULT SLP TREATMENT - 08/25/19 0001      General Information   Behavior/Cognition  Alert;Cooperative;Pleasant mood    HPI  Carmen Gonzalez is a 68 year old woman with word finding difficulties since September 2019 that she associates with as particular medication for COPD/asthma.  An MRI (07/01/2019) was "unremarkable exam for age".   Patient does not report any prior cognitive-linguistic  challenges. She reports managing her own medication, finances, personal responsibilities, etc. independently.  The patient reports frequently experiencing word finding difficulties, which she describes as "not remembering" what she wanted to say mid-sentence, and mispronunciation of words. Although she does not believe her functional communication or activities of daily living are hindered, she feels "irritated" by these problems and would like to improve her ability to speak fluently.       Treatment Provided   Treatment provided  Cognitive-Linquistic      Pain Assessment   Pain Assessment  No/denies pain      Cognitive-Linquistic Treatment   Treatment focused on  Cognition;Aphasia    Skilled Treatment  The patient generated fluent, cogent, grammatically correct sentences during completion of an abstract linguistic task with 72% accuracy independently. Given minimal cueing, accuracy improved to 92%. During a high level word finding task, the patient achieved 75% accuracy without interventions. Given semantic cueing, accuracy increased to 90%. During a semantic feature analysis task, the patient was able to expand her initial list of associated/related words given minimal-moderate cueing. The patient completed a complex immediate recall/auditory attention task with 97% accuracy without interventions. Given semantic cueing and 1 repetition, accuracy improved to 100%. The patient read a paragraph aloud with 70% fluency on the first trial. Given minimal cueing to purposefully attend to coordination  of breath support for speech and use strategic pausing, fluency improved to 85% on the second trial and 90% on the third trial.       Assessment / Recommendations / Plan   Plan  Continue with current plan of care      Progression Toward Goals   Progression toward goals  Progressing toward goals       SLP Education - 08/25/19 1215    Education Details  Pay good attention to the periods and commas to help  you figure out when to pause for breath while reading aloud.    Person(s) Educated  Patient    Methods  Explanation;Demonstration    Comprehension  Verbalized understanding;Returned demonstration         SLP Long Term Goals - 07/23/19 1507      SLP LONG TERM GOAL #1   Title  Patient will complete high level word finding tasks with 80% accuracy.    Time  8    Period  Weeks    Status  New    Target Date  09/16/19      SLP LONG TERM GOAL #2   Title  Patient will generate grammatical, fluent, and cogent sentences to complete abstract/complex linguistic task with 80% accuracy.    Time  8    Period  Weeks    Status  New    Target Date  09/16/19      SLP LONG TERM GOAL #3   Title  Patient will read aloud paragraphs with 90% fluency.    Time  8    Period  Weeks    Status  New    Target Date  09/16/19      SLP LONG TERM GOAL #4   Title  Patient will complete complex immediate recall and attention tasks with 80% accuracy.    Time  8    Period  Weeks    Status  New    Target Date  09/16/19       Plan - 08/25/19 1216    Clinical Impression Statement  The patient presents with mild cognitive-communication deficits characterized by reduced content and fluency during spontaneous speech, word finding difficulties, and reduced attention and immediate recall. She is making progress with decreasing her frustration and impatience with herself during moments of word finding and recall difficulties as she becomes more comfortable within the structured therapy context. She reports ongoing difficulties with "choppy" speech in her home environment with her family, though she believes that giving purposeful attention to coordination of breath support helps improve her reading fluency. She is motivated to learn compensatory strategies for improving her cognitive-communication skills and exhibits progress with implementing them during structured linguistic tasks. Patient would benefit from increased  generalization of compensatory strategy use outside of the therapy setting.    Speech Therapy Frequency  2x / week    Duration  Other (comment)    Treatment/Interventions  Language facilitation;Patient/family education;Compensatory strategies;SLP instruction and feedback    Potential to Achieve Goals  Good    Potential Considerations  Ability to learn/carryover information;Previous level of function;Co-morbidities;Severity of impairments;Cooperation/participation level;Family/community support    SLP Home Exercise Plan  Provided    Consulted and Agree with Plan of Care  Patient       Patient will benefit from skilled therapeutic intervention in order to improve the following deficits and impairments:   Anomia  Cognitive communication deficit    Problem List There are no active problems to display for this patient.  Maximillion Gill A. Francis Dowse., Graduate Clinician Vella Kohler 08/25/2019, 12:17 PM  [place] MAIN [facility] SERVICES [address] [place], [place], [address] Phone: [phone]   Fax:  [phone]   Name: Carmen Gonzalez MRN: [id number] Date of Birth: 23-Jul-1951

## 2019-08-27 ENCOUNTER — Encounter: Payer: Self-pay | Admitting: Speech Pathology

## 2019-08-27 ENCOUNTER — Other Ambulatory Visit: Payer: Self-pay

## 2019-08-27 ENCOUNTER — Ambulatory Visit: Payer: Medicare HMO | Admitting: Speech Pathology

## 2019-08-27 DIAGNOSIS — R488 Other symbolic dysfunctions: Secondary | ICD-10-CM | POA: Diagnosis not present

## 2019-08-27 DIAGNOSIS — R41841 Cognitive communication deficit: Secondary | ICD-10-CM

## 2019-08-27 NOTE — Therapy (Signed)
[place] MAIN [facility] SERVICES [address] [place], [place], [address] Phone: [phone]   Fax:  [phone]  Speech Language Pathology Treatment  Patient Details  Name: Carmen Gonzalez MRN: [id number] Date of Birth: 02-10-1951 Referring Provider (SLP): Dr. Manuella Ghazi   Encounter Date: 08/27/2019  End of Session - 08/27/19 1239    Visit Number  8    Number of Visits  17    Date for SLP Re-Evaluation  09/16/19    Authorization Type  Medicare    Authorization Time Period  Start 07/22/2019    Authorization - Visit Number  8    Authorization - Number of Visits  10    SLP Start Time  0900    SLP Stop Time   0950    SLP Time Calculation (min)  50 min    Activity Tolerance  Patient tolerated treatment well       Past Medical History:  Diagnosis Date  . Allergy   . Asthma   . COPD (chronic obstructive pulmonary disease) ([place])   . Hypercholesteremia   . Hyperlipidemia   . Hypertension   . Tuberculosis     Past Surgical History:  Procedure Laterality Date  . COLONOSCOPY WITH PROPOFOL N/A 01/17/2016   Procedure: COLONOSCOPY WITH PROPOFOL;  Surgeon: Manya Silvas, MD;  Location: [facility] ENDOSCOPY;  Service: Endoscopy;  Laterality: N/A;    There were no vitals filed for this visit.  Subjective Assessment - 08/27/19 1238    Subjective  The patient was alert, cooperative, and pleasant throughout the therapy session. She exhibited some difficulty focusing during today's session, which she attributed to having forgotten to take her allergy medication this morning, causing her allergies to flare up.            ADULT SLP TREATMENT - 08/27/19 0001      General Information   Behavior/Cognition  Alert;Cooperative;Pleasant mood    HPI  Carmen Gonzalez is a 68 year old woman with word finding difficulties since September 2019 that she associates with as particular medication for COPD/asthma.  An MRI (07/01/2019) was "unremarkable exam for age".    Patient does not report any prior cognitive-linguistic challenges. She reports managing her own medication, finances, personal responsibilities, etc. independently.  The patient reports frequently experiencing word finding difficulties, which she describes as "not remembering" what she wanted to say mid-sentence, and mispronunciation of words. Although she does not believe her functional communication or activities of daily living are hindered, she feels "irritated" by these problems and would like to improve her ability to speak fluently.       Treatment Provided   Treatment provided  Cognitive-Linquistic      Pain Assessment   Pain Assessment  No/denies pain      Cognitive-Linquistic Treatment   Treatment focused on  Cognition;Aphasia    Skilled Treatment  The graduate clinician first demonstrated completion of a complex, abstract linguistic task. The patient was then prompted to complete the next trial and to generate fluent, cogent, grammatically correct sentences to explain her process for solving the puzzle. Moderate assistance was needed for the patient to successfully complete the task, and she was 50% accurate for generating fluent, cogent, grammatically correct sentences to demonstrate comprehension. During a high level word finding task, the patient achieved 76% accuracy independently. Given semantic cueing, accuracy increased to 95%. During a semantic feature analysis task, the patient was able to expand her initial list of associated/related words given minimal cueing. The patient completed  a complex immediate recall/auditory attention task with 86% accuracy without interventions. Given minimal semantic cueing, accuracy improved to 100%. The patient read a paragraph aloud with 60% fluency on the first trial. Given minimal cueing to purposefully attend to coordination of breath support for speech and use strategic pausing, the patient self-corrected all reading errors on the second trial. It  was noted during this task that the patient's reading disfluency was secondary to uncoordinated intonation patterns (rather than coordination of respiration for speech).        Assessment / Recommendations / Plan   Plan  Continue with current plan of care      Progression Toward Goals   Progression toward goals  Progressing toward goals       SLP Education - 08/27/19 1238    Education Details  Word finding strategies.    Person(s) Educated  Patient    Methods  Explanation;Demonstration    Comprehension  Verbalized understanding;Returned demonstration         SLP Long Term Goals - 07/23/19 1507      SLP LONG TERM GOAL #1   Title  Patient will complete high level word finding tasks with 80% accuracy.    Time  8    Period  Weeks    Status  New    Target Date  09/16/19      SLP LONG TERM GOAL #2   Title  Patient will generate grammatical, fluent, and cogent sentences to complete abstract/complex linguistic task with 80% accuracy.    Time  8    Period  Weeks    Status  New    Target Date  09/16/19      SLP LONG TERM GOAL #3   Title  Patient will read aloud paragraphs with 90% fluency.    Time  8    Period  Weeks    Status  New    Target Date  09/16/19      SLP LONG TERM GOAL #4   Title  Patient will complete complex immediate recall and attention tasks with 80% accuracy.    Time  8    Period  Weeks    Status  New    Target Date  09/16/19       Plan - 08/27/19 1239    Clinical Impression Statement  The patient presents with mild cognitive-communication deficits characterized by reduced content and fluency during spontaneous speech, word finding difficulties, and reduced attention and immediate recall. She demonstrates motivation to learn compensatory strategies for improving her cognitive-communication skills and exhibits progress with implementing them during structured linguistic tasks. However, she reports ongoing difficulties with "choppy" speech in her home  environment with her family and would benefit from improved generalization of compensatory strategy use outside of the therapy setting.    Speech Therapy Frequency  2x / week    Duration  Other (comment)    Treatment/Interventions  Language facilitation;Patient/family education;Compensatory strategies;SLP instruction and feedback    Potential to Achieve Goals  Good    Potential Considerations  Ability to learn/carryover information;Previous level of function;Co-morbidities;Severity of impairments;Cooperation/participation level;Family/community support    SLP Home Exercise Plan  Provided    Consulted and Agree with Plan of Care  Patient       Patient will benefit from skilled therapeutic intervention in order to improve the following deficits and impairments:   Cognitive communication deficit  Anomia    Problem List There are no active problems to display for this patient.  Iesha Summerhill A.  Francis Dowse., Graduate Clinician Vella Kohler 08/27/2019, 12:40 PM  [place] MAIN [facility] SERVICES [address] [place], [place], [address] Phone: [phone]   Fax:  [phone]   Name: Carmen Gonzalez MRN: [id number] Date of Birth: 1951/07/02

## 2019-09-01 ENCOUNTER — Other Ambulatory Visit: Payer: Self-pay

## 2019-09-01 ENCOUNTER — Encounter: Payer: Self-pay | Admitting: Speech Pathology

## 2019-09-01 ENCOUNTER — Ambulatory Visit: Payer: Medicare HMO | Admitting: Speech Pathology

## 2019-09-01 DIAGNOSIS — R488 Other symbolic dysfunctions: Secondary | ICD-10-CM

## 2019-09-01 DIAGNOSIS — R41841 Cognitive communication deficit: Secondary | ICD-10-CM

## 2019-09-01 NOTE — Therapy (Signed)
[place] MAIN [facility] SERVICES [address] [place], [place], [address] Phone: [phone]   Fax:  [phone]  Speech Language Pathology Treatment  Patient Details  Name: LOLLA WARNES MRN: [id number] Date of Birth: 06/11/1951 Referring Provider (SLP): Dr. Manuella Ghazi   Encounter Date: 09/01/2019  End of Session - 09/01/19 0952    Visit Number  9    Number of Visits  17    Date for SLP Re-Evaluation  09/16/19    Authorization Type  Medicare    Authorization Time Period  Start 07/22/2019    Authorization - Number of Visits  10    SLP Start Time  0900    SLP Stop Time   0945    SLP Time Calculation (min)  45 min    Activity Tolerance  Patient tolerated treatment well       Past Medical History:  Diagnosis Date  . Allergy   . Asthma   . COPD (chronic obstructive pulmonary disease) ([place])   . Hypercholesteremia   . Hyperlipidemia   . Hypertension   . Tuberculosis     Past Surgical History:  Procedure Laterality Date  . COLONOSCOPY WITH PROPOFOL N/A 01/17/2016   Procedure: COLONOSCOPY WITH PROPOFOL;  Surgeon: Manya Silvas, MD;  Location: [facility] ENDOSCOPY;  Service: Endoscopy;  Laterality: N/A;    There were no vitals filed for this visit.  Subjective Assessment - 09/01/19 0951    Subjective  Patient shared results of neuropsychology testing            ADULT SLP TREATMENT - 09/01/19 0001      General Information   Behavior/Cognition  Alert;Cooperative;Pleasant mood    HPI  Ajani Neubauer is a 68 year old woman with word finding difficulties since September 2019 that she associates with as particular medication for COPD/asthma.  An MRI (07/01/2019) was "unremarkable exam for age".   Patient does not report any prior cognitive-linguistic challenges. She reports managing her own medication, finances, personal responsibilities, etc. independently.  The patient reports frequently experiencing word finding difficulties, which she  describes as "not remembering" what she wanted to say mid-sentence, and mispronunciation of words. Although she does not believe her functional communication or activities of daily living are hindered, she feels "irritated" by these problems and would like to improve her ability to speak fluently.       Pain Assessment   Pain Assessment  No/denies pain      Cognitive-Linquistic Treatment   Treatment focused on  Cognition;Aphasia    Skilled Treatment  WORD FINDING: State item that does not belong in list of 5 and state reason with 80% accuracy.  Demonstrate knowledge of words with multiple meanings, but has difficulty giving meaningful definition using words with content.  Completes simple verbal analogies with 100% accuracy and state analogous relationship with 100% accuracy given cues for specificity.  Complete word deduction task given 3 clue words with 90% accuracy.  COGNITION: Patient shared results of her neuropsychology testing: slowed information processing, reduced verbal reasoning, and word retrieval difficulties.      Assessment / Recommendations / Plan   Plan  Continue with current plan of care      Progression Toward Goals   Progression toward goals  Progressing toward goals       SLP Education - 09/01/19 0951    Education Details  POC    Person(s) Educated  Patient    Methods  Explanation    Comprehension  Verbalized understanding  SLP Long Term Goals - 07/23/19 1507      SLP LONG TERM GOAL #1   Title  Patient will complete high level word finding tasks with 80% accuracy.    Time  8    Period  Weeks    Status  New    Target Date  09/16/19      SLP LONG TERM GOAL #2   Title  Patient will generate grammatical, fluent, and cogent sentences to complete abstract/complex linguistic task with 80% accuracy.    Time  8    Period  Weeks    Status  New    Target Date  09/16/19      SLP LONG TERM GOAL #3   Title  Patient will read aloud paragraphs with 90% fluency.     Time  8    Period  Weeks    Status  New    Target Date  09/16/19      SLP LONG TERM GOAL #4   Title  Patient will complete complex immediate recall and attention tasks with 80% accuracy.    Time  8    Period  Weeks    Status  New    Target Date  09/16/19       Plan - 09/01/19 [id number]    Clinical Impression Statement  The patient is demonstrating difficulty expressing abstract/complex thoughts.  Will continue ST with focus on word retrieval, information processing, and verbal reasoning.    Speech Therapy Frequency  2x / week    Duration  Other (comment)    Treatment/Interventions  Language facilitation;Patient/family education;Compensatory strategies;SLP instruction and feedback    Potential to Achieve Goals  Good    Potential Considerations  Ability to learn/carryover information;Previous level of function;Co-morbidities;Severity of impairments;Cooperation/participation level;Family/community support       Patient will benefit from skilled therapeutic intervention in order to improve the following deficits and impairments:   Cognitive communication deficit  Anomia    Problem List There are no active problems to display for this patient.  Leroy Sea, MS/CCC- SLP  Lou Miner 09/01/2019, 9:53 AM  [place] MAIN [facility] SERVICES [address] [place], [place], [address] Phone: [phone]   Fax:  [phone]   Name: SALAMA STUVER MRN: [id number] Date of Birth: 1951/06/20

## 2019-09-03 ENCOUNTER — Other Ambulatory Visit: Payer: Self-pay

## 2019-09-03 ENCOUNTER — Ambulatory Visit: Payer: Medicare HMO | Admitting: Speech Pathology

## 2019-09-03 ENCOUNTER — Encounter: Payer: Self-pay | Admitting: Speech Pathology

## 2019-09-03 DIAGNOSIS — R41841 Cognitive communication deficit: Secondary | ICD-10-CM

## 2019-09-03 DIAGNOSIS — R488 Other symbolic dysfunctions: Secondary | ICD-10-CM | POA: Diagnosis not present

## 2019-09-03 NOTE — Therapy (Signed)
[place] MAIN [facility] SERVICES [address] [place], [place], [address] Phone: [phone]   Fax:  [phone]  Speech Language Pathology Treatment/Progress Note  Speech Therapy Progress Note   Dates of reporting period  07/22/2019   to   09/03/2019   Patient Details  Name: DRAVEN LAINE MRN: [id number] Date of Birth: [birth year] Referring Provider (SLP): Dr. Manuella Ghazi   Encounter Date: 09/03/2019  End of Session - 09/03/19 1252    Visit Number  10    Number of Visits  17    Date for SLP Re-Evaluation  09/16/19    Authorization Type  Medicare    Authorization Time Period  Start 07/22/2019    Authorization - Visit Number  9    Authorization - Number of Visits  10    SLP Start Time  0900    SLP Stop Time   0950    SLP Time Calculation (min)  50 min    Activity Tolerance  Patient tolerated treatment well       Past Medical History:  Diagnosis Date  . Allergy   . Asthma   . COPD (chronic obstructive pulmonary disease) ([place])   . Hypercholesteremia   . Hyperlipidemia   . Hypertension   . Tuberculosis     Past Surgical History:  Procedure Laterality Date  . COLONOSCOPY WITH PROPOFOL N/A 01/17/2016   Procedure: COLONOSCOPY WITH PROPOFOL;  Surgeon: Manya Silvas, MD;  Location: [facility] ENDOSCOPY;  Service: Endoscopy;  Laterality: N/A;    There were no vitals filed for this visit.  Subjective Assessment - 09/03/19 1250    Subjective  The patient was alert, cooperative, and pleasant throughout the therapy session. She shared stories about her dogs.            ADULT SLP TREATMENT - 09/03/19 0001      General Information   Behavior/Cognition  Alert;Cooperative;Pleasant mood    HPI  Valbona Slabach is a 68 year old woman with word finding difficulties since September 2019 that she associates with as particular medication for COPD/asthma.  An MRI (07/01/2019) was "unremarkable exam for age".   Patient does not report any prior  cognitive-linguistic challenges. She reports managing her own medication, finances, personal responsibilities, etc. independently.  The patient reports frequently experiencing word finding difficulties, which she describes as "not remembering" what she wanted to say mid-sentence, and mispronunciation of words. Although she does not believe her functional communication or activities of daily living are hindered, she feels "irritated" by these problems and would like to improve her ability to speak fluently.       Treatment Provided   Treatment provided  Cognitive-Linquistic      Pain Assessment   Pain Assessment  No/denies pain      Cognitive-Linquistic Treatment   Treatment focused on  Cognition;Aphasia    Skilled Treatment  During a high level word finding task, the patient achieved 65% accuracy independently. Given semantic and phonemic cueing, accuracy increased to 90%. The patient completed a complex immediate recall/auditory attention task with 91% accuracy without interventions. Given minimal semantic cueing, accuracy improved to 100%. The patient read a paragraph aloud with 80% fluency independently. During a complex, abstract linguistic task, the patient was able to generate fluent, cogent, grammatically correct sentences to explain her process for solving the puzzle with 75% accuracy without interventions. Given moderate prompting, accuracy increased to 85%. During this task, patient was noted with slow information processing and difficulty integrating multiple pieces of information simultaneously.  Assessment / Recommendations / Plan   Plan  Continue with current plan of care      Progression Toward Goals   Progression toward goals  Progressing toward goals       SLP Education - 09/03/19 1250    Education Details  Discussed relevance of abstract problem solving task to functional cognitive and word finding treatment goals.    Person(s) Educated  Patient    Methods  Explanation     Comprehension  Verbalized understanding         SLP Long Term Goals - 09/03/19 1357      SLP LONG TERM GOAL #1   Title  Patient will complete high level word finding tasks with 80% accuracy.    Status  Partially Met    Target Date  09/16/19      SLP LONG TERM GOAL #2   Title  Patient will generate grammatical, fluent, and cogent sentences to complete abstract/complex linguistic task with 80% accuracy.    Status  Partially Met    Target Date  09/16/19      SLP LONG TERM GOAL #3   Title  Patient will read aloud paragraphs with 90% fluency.    Status  Partially Met    Target Date  09/16/19      SLP LONG TERM GOAL #4   Title  Patient will complete complex immediate recall and attention tasks with 80% accuracy.    Status  Partially Met    Target Date  09/16/19       Plan - 09/03/19 1252    Clinical Impression Statement  The patient presents with mild cognitive-communication deficits characterized by reduced content and fluency during spontaneous speech, word finding difficulties, and reduced attention and immediate recall. She is motivated to improve her cognitive-communication skills and exhibits progress with implementing compensatory strategies during structured linguistic tasks. She reports variable progress with using the strategies learned in therapy in her home environment when communicating with her family and would benefit from further generalization of these techniques outside of the therapy setting.    Speech Therapy Frequency  2x / week    Duration  Other (comment)    Treatment/Interventions  Language facilitation;Patient/family education;Compensatory strategies;SLP instruction and feedback    Potential to Achieve Goals  Good    Potential Considerations  Ability to learn/carryover information;Previous level of function;Co-morbidities;Severity of impairments;Cooperation/participation level;Family/community support    SLP Home Exercise Plan  Provided    Consulted and  Agree with Plan of Care  Patient       Patient will benefit from skilled therapeutic intervention in order to improve the following deficits and impairments:   Cognitive communication deficit  Anomia    Problem List There are no active problems to display for this patient.  Candace Begue A. Francis Dowse., Graduate Clinician Lou Miner 09/03/2019, 1:59 PM  [place] MAIN [facility] SERVICES [address] [place], [place], [address] Phone: [phone]   Fax:  [phone]   Name: DALYAH PLA MRN: [id number] Date of Birth: 11-Oct-1951

## 2019-09-09 ENCOUNTER — Encounter: Payer: Self-pay | Admitting: Speech Pathology

## 2019-09-09 ENCOUNTER — Other Ambulatory Visit: Payer: Self-pay

## 2019-09-09 ENCOUNTER — Ambulatory Visit: Payer: Medicare HMO | Admitting: Speech Pathology

## 2019-09-09 DIAGNOSIS — R41841 Cognitive communication deficit: Secondary | ICD-10-CM

## 2019-09-09 DIAGNOSIS — R488 Other symbolic dysfunctions: Secondary | ICD-10-CM

## 2019-09-09 NOTE — Therapy (Signed)
[place] MAIN [facility] SERVICES [address] [place], [place], [address] Phone: [phone]   Fax:  [phone]  Speech Language Pathology Treatment  Patient Details  Name: Carmen Gonzalez MRN: [id number] Date of Birth: 21-Mar-1951 Referring Provider (SLP): Dr. Manuella Ghazi   Encounter Date: 09/09/2019  End of Session - 09/09/19 0955    Visit Number  11    Number of Visits  17    Date for SLP Re-Evaluation  09/16/19    Authorization Type  Medicare    Authorization Time Period  Start 09/09/2019    Authorization - Visit Number  1    Authorization - Number of Visits  10    SLP Start Time  7253    SLP Stop Time   0945    SLP Time Calculation (min)  50 min    Activity Tolerance  Patient tolerated treatment well       Past Medical History:  Diagnosis Date  . Allergy   . Asthma   . COPD (chronic obstructive pulmonary disease) ([place])   . Hypercholesteremia   . Hyperlipidemia   . Hypertension   . Tuberculosis     Past Surgical History:  Procedure Laterality Date  . COLONOSCOPY WITH PROPOFOL N/A 01/17/2016   Procedure: COLONOSCOPY WITH PROPOFOL;  Surgeon: Manya Silvas, MD;  Location: [facility] ENDOSCOPY;  Service: Endoscopy;  Laterality: N/A;    There were no vitals filed for this visit.  Subjective Assessment - 09/09/19 0953    Subjective  The patient was alert, cooperative, and pleasant throughout the therapy session. She shared that she has been helping her other daughter and son-in-law move into a new house they're building.            ADULT SLP TREATMENT - 09/09/19 0001      General Information   Behavior/Cognition  Alert;Cooperative;Pleasant mood    HPI  Carmen Gonzalez is a 68 year old woman with word finding difficulties since September 2019 that she associates with as particular medication for COPD/asthma.  An MRI (07/01/2019) was "unremarkable exam for age".   Patient does not report any prior cognitive-linguistic challenges. She  reports managing her own medication, finances, personal responsibilities, etc. independently.  The patient reports frequently experiencing word finding difficulties, which she describes as "not remembering" what she wanted to say mid-sentence, and mispronunciation of words. Although she does not believe her functional communication or activities of daily living are hindered, she feels "irritated" by these problems and would like to improve her ability to speak fluently.       Treatment Provided   Treatment provided  Cognitive-Linquistic      Pain Assessment   Pain Assessment  No/denies pain      Cognitive-Linquistic Treatment   Treatment focused on  Cognition;Aphasia    Skilled Treatment  During a high level word finding task, the patient achieved 60% accuracy independently. Given semantic and phonemic cueing, accuracy increased to 96%. Delayed response times noted during word finding task. The patient completed a complex immediate recall/auditory attention task with 84% accuracy without interventions. Given 3 repetitions of information and minimal semantic cueing, accuracy improved to 100%. The patient read a paragraph aloud with 60% fluency. Patient independently stated that reading fluency performance was reduced, describing herself as getting "hung up" on some words. During a complex, abstract linguistic task, the patient was able to generate fluent, cogent, grammatically correct sentences to explain her process for solving the puzzle with 75% accuracy without interventions. Given minimal prompting,  accuracy increased to 90%. During this task, patient was noted with slow information processing and difficulty integrating multiple pieces of information simultaneously.      Assessment / Recommendations / Plan   Plan  Continue with current plan of care      Progression Toward Goals   Progression toward goals  Progressing toward goals       SLP Education - 09/09/19 0954    Education Details   Discussed patient's progress with complex, abstract problem-solving tasks as they become more familiar to her.    Person(s) Educated  Patient    Methods  Explanation    Comprehension  Verbalized understanding         SLP Long Term Goals - 09/03/19 1357      SLP LONG TERM GOAL #1   Title  Patient will complete high level word finding tasks with 80% accuracy.    Status  Partially Met    Target Date  09/16/19      SLP LONG TERM GOAL #2   Title  Patient will generate grammatical, fluent, and cogent sentences to complete abstract/complex linguistic task with 80% accuracy.    Status  Partially Met    Target Date  09/16/19      SLP LONG TERM GOAL #3   Title  Patient will read aloud paragraphs with 90% fluency.    Status  Partially Met    Target Date  09/16/19      SLP LONG TERM GOAL #4   Title  Patient will complete complex immediate recall and attention tasks with 80% accuracy.    Status  Partially Met    Target Date  09/16/19       Plan - 09/09/19 1165    Clinical Impression Statement  The patient presents with mild cognitive-communication deficits characterized by reduced content and fluency during spontaneous speech, word finding difficulties, and reduced reading fluency. Patient consistently performs well on complex immediate recall/auditory attention tasks. Patient reports variable progress with implementing the compensatory strategies learned in therapy in her home environment when communicating with her family and would benefit from further generalization of these techniques outside of the therapy setting.    Speech Therapy Frequency  2x / week    Duration  Other (comment)    Treatment/Interventions  Language facilitation;Patient/family education;Compensatory strategies;SLP instruction and feedback    Potential to Achieve Goals  Good    Potential Considerations  Ability to learn/carryover information;Previous level of function;Co-morbidities;Severity of  impairments;Cooperation/participation level;Family/community support    SLP Home Exercise Plan  Provided    Consulted and Agree with Plan of Care  Patient       Patient will benefit from skilled therapeutic intervention in order to improve the following deficits and impairments:   Cognitive communication deficit  Anomia    Problem List There are no active problems to display for this patient.  Carmen Gonzalez A. Francis Dowse., Graduate Clinician Lou Miner 09/09/2019, 2:18 PM  [place] MAIN [facility] SERVICES [address] [place], [place], [address] Phone: [phone]   Fax:  [phone]   Name: Carmen Gonzalez MRN: [id number] Date of Birth: 21-Jul-1951

## 2019-09-11 ENCOUNTER — Ambulatory Visit: Payer: Medicare HMO | Admitting: Speech Pathology

## 2019-09-11 ENCOUNTER — Other Ambulatory Visit: Payer: Self-pay

## 2019-09-11 ENCOUNTER — Encounter: Payer: Self-pay | Admitting: Speech Pathology

## 2019-09-11 DIAGNOSIS — R488 Other symbolic dysfunctions: Secondary | ICD-10-CM | POA: Diagnosis not present

## 2019-09-11 DIAGNOSIS — R41841 Cognitive communication deficit: Secondary | ICD-10-CM

## 2019-09-11 NOTE — Therapy (Signed)
[place] MAIN [facility] SERVICES [address] [place], [place], [address] Phone: [phone]   Fax:  [phone]  Speech Language Pathology Treatment  Patient Details  Name: Carmen Gonzalez MRN: [id number] Date of Birth: December 26, 1950 Referring Provider (SLP): Dr. Manuella Ghazi   Encounter Date: 09/11/2019  End of Session - 09/11/19 1051    Visit Number  12    Number of Visits  17    Date for SLP Re-Evaluation  09/16/19    Authorization Type  Medicare    Authorization Time Period  Start 09/09/2019    Authorization - Visit Number  2    Authorization - Number of Visits  10    SLP Start Time  0900    SLP Stop Time   0950    SLP Time Calculation (min)  50 min    Activity Tolerance  Patient tolerated treatment well       Past Medical History:  Diagnosis Date  . Allergy   . Asthma   . COPD (chronic obstructive pulmonary disease) ([place])   . Hypercholesteremia   . Hyperlipidemia   . Hypertension   . Tuberculosis     Past Surgical History:  Procedure Laterality Date  . COLONOSCOPY WITH PROPOFOL N/A 01/17/2016   Procedure: COLONOSCOPY WITH PROPOFOL;  Surgeon: Manya Silvas, MD;  Location: [facility] ENDOSCOPY;  Service: Endoscopy;  Laterality: N/A;    There were no vitals filed for this visit.  Subjective Assessment - 09/11/19 1050    Subjective  The patient was alert, cooperative, and pleasant throughout the therapy session. She shared that her heating and air system in her home was replaced yesterday.            ADULT SLP TREATMENT - 09/11/19 0001      General Information   Behavior/Cognition  Alert;Cooperative;Pleasant mood    HPI  Carmen Gonzalez is a 68 year old woman with word finding difficulties since September 2019 that she associates with as particular medication for COPD/asthma.  An MRI (07/01/2019) was "unremarkable exam for age".   Patient does not report any prior cognitive-linguistic challenges. She reports managing her own  medication, finances, personal responsibilities, etc. independently.  The patient reports frequently experiencing word finding difficulties, which she describes as "not remembering" what she wanted to say mid-sentence, and mispronunciation of words. Although she does not believe her functional communication or activities of daily living are hindered, she feels "irritated" by these problems and would like to improve her ability to speak fluently.       Treatment Provided   Treatment provided  Cognitive-Linquistic      Pain Assessment   Pain Assessment  No/denies pain      Cognitive-Linquistic Treatment   Treatment focused on  Cognition;Aphasia    Skilled Treatment  Patient reported worsening verbal fluency difficulty during a family dinner this week. She shared that she has chronic sleep troubles and felt especially tired at the time of dinner. Education was provided regarding the negative impact of sleep deprivation on the brain's ability to complete word finding and produce verbal fluency, and patient was encouraged to address sleep issues with PCP at upcoming annual visit in December. During a high level word finding task, the patient achieved 96% accuracy independently (with delayed response times). The patient completed a complex immediate recall/auditory attention task with 67% accuracy without interventions. Given semantic cueing, accuracy improved to 96%. The patient read a paragraph aloud with 80% fluency, given cueing for purposeful attention to pausing and  intonation patterns. During a complex, abstract linguistic task, the patient was able to generate fluent, cogent, grammatically correct sentences to explain her process for solving the puzzle with 80% accuracy without interventions. Given minimal-moderate cueing, accuracy increased to 95%. Patient was again noted with slow information processing during this task.      Assessment / Recommendations / Plan   Plan  Continue with current plan of  care      Progression Toward Goals   Progression toward goals  Progressing toward goals       SLP Education - 09/11/19 1050    Education Details  The punctuation marks are your friends! Use them to guide your pauses as you read.    Person(s) Educated  Patient    Methods  Explanation;Demonstration    Comprehension  Verbalized understanding;Returned demonstration         SLP Long Term Goals - 09/03/19 1357      SLP LONG TERM GOAL #1   Title  Patient will complete high level word finding tasks with 80% accuracy.    Status  Partially Met    Target Date  09/16/19      SLP LONG TERM GOAL #2   Title  Patient will generate grammatical, fluent, and cogent sentences to complete abstract/complex linguistic task with 80% accuracy.    Status  Partially Met    Target Date  09/16/19      SLP LONG TERM GOAL #3   Title  Patient will read aloud paragraphs with 90% fluency.    Status  Partially Met    Target Date  09/16/19      SLP LONG TERM GOAL #4   Title  Patient will complete complex immediate recall and attention tasks with 80% accuracy.    Status  Partially Met    Target Date  09/16/19       Plan - 09/11/19 1051    Clinical Impression Statement  The patient presents with mild cognitive-communication deficits characterized by reduced content and fluency during spontaneous speech, word finding difficulties, and reduced reading fluency. She is motivated to enhance her communication skills and works hard on all speech therapy tasks. Patient reports variable progress with implementing the compensatory strategies learned in therapy in her home environment when communicating with her family and would benefit from further generalization of these techniques outside of the therapy setting.    Speech Therapy Frequency  2x / week    Duration  Other (comment)    Treatment/Interventions  Language facilitation;Patient/family education;Compensatory strategies;SLP instruction and feedback     Potential to Achieve Goals  Good    Potential Considerations  Ability to learn/carryover information;Previous level of function;Co-morbidities;Severity of impairments;Cooperation/participation level;Family/community support    SLP Home Exercise Plan  Provided    Consulted and Agree with Plan of Care  Patient       Patient will benefit from skilled therapeutic intervention in order to improve the following deficits and impairments:   Cognitive communication deficit  Anomia    Problem List There are no active problems to display for this patient.  Carmen Gonzalez, B.A., Graduate Clinician Carmen Gonzalez 09/11/2019, 10:52 AM  [place] [facility] MAIN REHAB SERVICES [address] [place], [place], [address] Phone: [phone]   Fax:  [phone]   Name: Carmen Gonzalez MRN: [id number] Date of Birth: 09/01/1951 

## 2019-09-16 ENCOUNTER — Encounter: Payer: Medicare HMO | Admitting: Speech Pathology

## 2019-09-18 ENCOUNTER — Encounter: Payer: Self-pay | Admitting: Speech Pathology

## 2019-09-18 ENCOUNTER — Other Ambulatory Visit: Payer: Self-pay

## 2019-09-18 ENCOUNTER — Ambulatory Visit: Payer: Medicare HMO | Admitting: Speech Pathology

## 2019-09-18 DIAGNOSIS — R488 Other symbolic dysfunctions: Secondary | ICD-10-CM

## 2019-09-18 DIAGNOSIS — R41841 Cognitive communication deficit: Secondary | ICD-10-CM

## 2019-09-18 NOTE — Therapy (Signed)
[place] MAIN [facility] SERVICES [address] [place], [place], [address] Phone: [phone]   Fax:  [phone]  Speech Language Pathology Treatment/Discharge Summary  Patient Details  Name: Carmen Gonzalez MRN: [id number] Date of Birth: 1951-10-06 Referring Provider (SLP): Dr. Manuella Ghazi   Encounter Date: 09/18/2019  End of Session - 09/18/19 1151    Visit Number  13    Number of Visits  17    Date for SLP Re-Evaluation  09/16/19    Authorization Type  Medicare    Authorization Time Period  Start 09/09/2019    Authorization - Visit Number  3    Authorization - Number of Visits  10    SLP Start Time  [id number]    SLP Stop Time   0950    SLP Time Calculation (min)  55 min    Activity Tolerance  Patient tolerated treatment well       Past Medical History:  Diagnosis Date  . Allergy   . Asthma   . COPD (chronic obstructive pulmonary disease) ([place])   . Hypercholesteremia   . Hyperlipidemia   . Hypertension   . Tuberculosis     Past Surgical History:  Procedure Laterality Date  . COLONOSCOPY WITH PROPOFOL N/A 01/17/2016   Procedure: COLONOSCOPY WITH PROPOFOL;  Surgeon: Manya Silvas, MD;  Location: [facility] ENDOSCOPY;  Service: Endoscopy;  Laterality: N/A;    There were no vitals filed for this visit.  Subjective Assessment - 09/18/19 1150    Subjective  The patient was alert, cooperative, and pleasant throughout the therapy session. This was her final ST treatment session.            ADULT SLP TREATMENT - 09/18/19 0001      General Information   Behavior/Cognition  Alert;Cooperative;Pleasant mood    HPI  Carmen Gonzalez is a 68 year old woman with word finding difficulties since September 2019 that she associates with as particular medication for COPD/asthma.  An MRI (07/01/2019) was "unremarkable exam for age".   Patient does not report any prior cognitive-linguistic challenges. She reports managing her own medication, finances,  personal responsibilities, etc. independently.  The patient reports frequently experiencing word finding difficulties, which she describes as "not remembering" what she wanted to say mid-sentence, and mispronunciation of words. Although she does not believe her functional communication or activities of daily living are hindered, she feels "irritated" by these problems and would like to improve her ability to speak fluently.       Treatment Provided   Treatment provided  Cognitive-Linquistic      Pain Assessment   Pain Assessment  No/denies pain      Cognitive-Linquistic Treatment   Treatment focused on  Cognition;Aphasia    Skilled Treatment  During a high level word finding task, the patient achieved 92% accuracy independently (with delayed response times). Given semantic/phonemic cueing, accuracy increased to 96%. The patient completed a complex immediate recall/auditory attention task with 56% accuracy without interventions. Given cueing to implement memory strategies, accuracy improved to 96%. The patient read complex sentences aloud with 80% fluency independently. During a complex, abstract linguistic task, the patient was able to generate fluent, cogent, grammatically correct sentences to explain her reasoning with 80% accuracy without interventions. Given minimal cueing to expand information content, accuracy increased to 95%. Patient noted with delayed response times and slow information processing across therapy tasks.      Assessment / Recommendations / Plan   Plan  Continue with current plan of care  Progression Toward Goals   Progression toward goals  Progressing toward goals       SLP Education - 09/18/19 1150    Education Details  Discussion of progress over the course of the treatment period.    Person(s) Educated  Patient    Methods  Explanation    Comprehension  Verbalized understanding         SLP Long Term Goals - 09/18/19 1320      SLP LONG TERM GOAL #1    Title  Patient will complete high level word finding tasks with 80% accuracy.    Status  Achieved      SLP LONG TERM GOAL #2   Title  Patient will generate grammatical, fluent, and cogent sentences to complete abstract/complex linguistic task with 80% accuracy.    Status  Achieved      SLP LONG TERM GOAL #3   Title  Patient will read aloud paragraphs with 90% fluency.    Status  Achieved      SLP LONG TERM GOAL #4   Title  Patient will complete complex immediate recall and attention tasks with 80% accuracy.    Status  Achieved       Plan - 09/18/19 1151    Clinical Impression Statement  The patient presents with mild cognitive-communication deficits characterized by reduced content and fluency during spontaneous speech, word finding difficulties, and reduced reading fluency. She has been motivated to enhance her communication skills and has worked hard on all speech therapy tasks. Patient is able to retrieve target words and recall information when allowed sufficient processing and response times. She reports limited improvement in her everyday communication outside of the therapy setting, as she feels that her persisting delayed processing and response times continue to reduce her verbal fluency. Patient verbalizes agreement with the SLP that although her communication is less efficient than it used to be, it remains effective, and she expresses intent to work towards accepting this change. Patient agrees to be discharged from [place] treatment.    Speech Therapy Frequency  2x / week    Duration  Other (comment)    Treatment/Interventions  Language facilitation;Patient/family education;Compensatory strategies;SLP instruction and feedback    Potential to Achieve Goals  Good    Potential Considerations  Ability to learn/carryover information;Previous level of function;Co-morbidities;Severity of impairments;Cooperation/participation level;Family/community support    SLP Home Exercise Plan  Provided     Consulted and Agree with Plan of Care  Patient       Patient will benefit from skilled therapeutic intervention in order to improve the following deficits and impairments:   Cognitive communication deficit  Anomia    Problem List There are no active problems to display for this patient.  Doneta Bayman A. [place] Dowse., Graduate Clinician Lou Miner 09/18/2019, 1:22 PM  [place] MAIN [facility] SERVICES [address] [place], [place], [address] Phone: [phone]   Fax:  [phone]   Name: Carmen Gonzalez MRN: [id number] Date of Birth: 10/13/1951

## 2019-10-24 ENCOUNTER — Other Ambulatory Visit: Payer: Self-pay | Admitting: Physician Assistant

## 2019-10-24 DIAGNOSIS — Z1231 Encounter for screening mammogram for malignant neoplasm of breast: Secondary | ICD-10-CM

## 2019-12-05 ENCOUNTER — Ambulatory Visit
Admission: RE | Admit: 2019-12-05 | Discharge: 2019-12-05 | Disposition: A | Discharge status: Home / Self Care | Payer: Medicare HMO | Source: Ambulatory Visit | Attending: Physician Assistant | Admitting: Physician Assistant

## 2019-12-05 DIAGNOSIS — Z1231 Encounter for screening mammogram for malignant neoplasm of breast: Secondary | ICD-10-CM | POA: Diagnosis present

## 2020-11-09 ENCOUNTER — Other Ambulatory Visit: Payer: Self-pay | Admitting: Physician Assistant

## 2020-11-09 DIAGNOSIS — Z1231 Encounter for screening mammogram for malignant neoplasm of breast: Secondary | ICD-10-CM

## 2020-12-07 ENCOUNTER — Other Ambulatory Visit: Payer: Self-pay

## 2020-12-07 ENCOUNTER — Ambulatory Visit
Admission: RE | Admit: 2020-12-07 | Discharge: 2020-12-07 | Disposition: A | Discharge status: Home / Self Care | Payer: Medicare HMO | Source: Ambulatory Visit | Attending: Physician Assistant | Admitting: Physician Assistant

## 2020-12-07 DIAGNOSIS — Z1231 Encounter for screening mammogram for malignant neoplasm of breast: Secondary | ICD-10-CM | POA: Insufficient documentation

## 2021-05-03 ENCOUNTER — Encounter: Payer: Self-pay | Admitting: Internal Medicine

## 2021-05-04 ENCOUNTER — Encounter
Admission: RE | Disposition: A | Discharge status: Home / Self Care | Payer: Self-pay | Source: Home / Self Care | Attending: Internal Medicine

## 2021-05-04 ENCOUNTER — Encounter: Payer: Self-pay | Admitting: Internal Medicine

## 2021-05-04 ENCOUNTER — Ambulatory Visit
Admission: RE | Admit: 2021-05-04 | Discharge: 2021-05-04 | Disposition: A | Discharge status: Home / Self Care | Payer: Medicare HMO | Attending: Internal Medicine | Admitting: Internal Medicine

## 2021-05-04 ENCOUNTER — Ambulatory Visit: Payer: Medicare HMO | Admitting: Anesthesiology

## 2021-05-04 DIAGNOSIS — Z881 Allergy status to other antibiotic agents status: Secondary | ICD-10-CM | POA: Insufficient documentation

## 2021-05-04 DIAGNOSIS — Z79899 Other long term (current) drug therapy: Secondary | ICD-10-CM | POA: Diagnosis not present

## 2021-05-04 DIAGNOSIS — K640 First degree hemorrhoids: Secondary | ICD-10-CM | POA: Insufficient documentation

## 2021-05-04 DIAGNOSIS — Z1211 Encounter for screening for malignant neoplasm of colon: Secondary | ICD-10-CM | POA: Diagnosis not present

## 2021-05-04 DIAGNOSIS — K621 Rectal polyp: Secondary | ICD-10-CM | POA: Insufficient documentation

## 2021-05-04 DIAGNOSIS — Z7982 Long term (current) use of aspirin: Secondary | ICD-10-CM | POA: Diagnosis not present

## 2021-05-04 DIAGNOSIS — Z880 Allergy status to penicillin: Secondary | ICD-10-CM | POA: Insufficient documentation

## 2021-05-04 DIAGNOSIS — Z886 Allergy status to analgesic agent status: Secondary | ICD-10-CM | POA: Diagnosis not present

## 2021-05-04 DIAGNOSIS — Z7951 Long term (current) use of inhaled steroids: Secondary | ICD-10-CM | POA: Insufficient documentation

## 2021-05-04 HISTORY — DX: Deviated nasal septum: J34.2

## 2021-05-04 HISTORY — DX: Obesity, unspecified: E66.9

## 2021-05-04 HISTORY — PX: COLONOSCOPY WITH PROPOFOL: SHX5780

## 2021-05-04 HISTORY — DX: Unspecified glaucoma: H40.9

## 2021-05-04 HISTORY — DX: Unspecified osteoarthritis, unspecified site: M19.90

## 2021-05-04 HISTORY — DX: Cortical age-related cataract, unspecified eye: H25.019

## 2021-05-04 HISTORY — DX: Type 2 diabetes mellitus without complications: E11.9

## 2021-05-04 LAB — GLUCOSE, CAPILLARY: Glucose-Capillary: 101 mg/dL — ABNORMAL HIGH (ref 70–99)

## 2021-05-04 SURGERY — COLONOSCOPY WITH PROPOFOL
Anesthesia: General

## 2021-05-04 MED ORDER — PROPOFOL 10 MG/ML IV BOLUS
INTRAVENOUS | Status: DC | PRN
  Administered 2021-05-04: 70 mg via INTRAVENOUS

## 2021-05-04 MED ORDER — PROPOFOL 500 MG/50ML IV EMUL
INTRAVENOUS | Status: DC | PRN
  Administered 2021-05-04: 145 ug/kg/min via INTRAVENOUS

## 2021-05-04 MED ORDER — PROPOFOL 500 MG/50ML IV EMUL
INTRAVENOUS | Status: AC
  Filled 2021-05-04: qty 50

## 2021-05-04 MED ORDER — GLYCOPYRROLATE 0.2 MG/ML IJ SOLN
INTRAMUSCULAR | Status: DC | PRN
  Administered 2021-05-04: .2 mg via INTRAVENOUS

## 2021-05-04 MED ORDER — SODIUM CHLORIDE 0.9 % IV SOLN
INTRAVENOUS | Status: DC
  Administered 2021-05-04: 1000 mL via INTRAVENOUS

## 2021-05-04 NOTE — H&P (Signed)
Outpatient short stay form Pre-procedure 05/04/2021 1:30 PM Carmen Gonzalez [name], M.D.  Primary Physician: Paulita Cradle, M.D.  Reason for visit:  Personal history of adenomatous colon polyp (2008)  History of present illness:                            Patient presents for colonoscopy for a personal hx of colon polyps. The patient denies abdominal pain, abnormal weight loss or rectal bleeding.      Current Facility-Administered Medications:    0.9 %  sodium chloride infusion, , Intravenous, Continuous, Keldan Eplin, Benay Pike, MD, Last Rate: 20 mL/hr at 05/04/21 1302, 1,000 mL at 05/04/21 1302  Medications Prior to Admission  Medication Sig Dispense Refill Last Dose   ACCU-CHEK AVIVA PLUS test strip 1 (ONE) STRIP IN VITRO TWICE A DAY , FASTING AND 2 HOURS AFTER ANY MEAL  1 05/04/2021   ACCU-CHEK FASTCLIX LANCETS MISC CHECK SUGAR TWICE A DAY  1 05/04/2021   cyanocobalamin 2000 MCG tablet Take 2,000 mcg by mouth daily.   Past Week   fluticasone (FLONASE) 50 MCG/ACT nasal spray Place 2 sprays into both nostrils daily.   05/04/2021   Fluticasone-Umeclidin-Vilant (TRELEGY ELLIPTA) 100-62.5-25 MCG/INH AEPB Inhale into the lungs daily.   05/04/2021 at 0620   loratadine (CLARITIN) 10 MG tablet Take 10 mg by mouth daily.   05/03/2021   montelukast (SINGULAIR) 10 MG tablet Take 10 mg by mouth daily.   05/03/2021   naproxen sodium (ANAPROX) 220 MG tablet Take 220 mg by mouth 2 (two) times daily with a meal. As needed for headache   Past Week   rosuvastatin (CRESTOR) 10 MG tablet Take 10 mg by mouth daily.   05/03/2021   triamterene-hydrochlorothiazide (MAXZIDE-25) 37.5-25 MG tablet Take 2 tablets by mouth daily.   05/03/2021   albuterol (PROVENTIL HFA;VENTOLIN HFA) 108 (90 BASE) MCG/ACT inhaler Inhale 2 puffs into the lungs every 6 (six) hours as needed for wheezing or shortness of breath. (Patient not taking: Reported on 05/04/2021)   Not Taking   aspirin 81 MG tablet Take 81 mg by mouth daily.      Blood  Glucose Monitoring Suppl (ACCU-CHEK AVIVA PLUS) w/Device KIT USE KIT AS DIRECTED  0    Fluticasone-Salmeterol (ADVAIR) 100-50 MCG/DOSE AEPB Inhale 1 puff into the lungs 2 (two) times daily. (Patient not taking: Reported on 05/04/2021)   Completed Course     Allergies  Allergen Reactions   Avelox [Moxifloxacin Hcl In Nacl] Anaphylaxis   Cephalosporins Other (See Comments)   Ciprocin-Fluocin-Procin [Fluocinolone] Other (See Comments)   Ciprofloxacin    Nabumetone Other (See Comments)    GI upset   Penicillins Other (See Comments)     Past Medical History:  Diagnosis Date   Allergy    Arthritis    Asthma    Cataract cortical, senile    COPD (chronic obstructive pulmonary disease) ([place])    Deviated septum    Diabetes mellitus without complication ([place])    Glaucoma    Hypercholesteremia    Hyperlipidemia    Hypertension    Obesity    Tuberculosis     Review of systems:  Otherwise negative.    Physical Exam  Gen: Alert, oriented. Appears stated age.  HEENT: [place]/AT. PERRLA. Lungs: CTA, no wheezes. CV: RR nl S1, S2. Abd: soft, benign, no masses. BS+ Ext: No edema. Pulses 2+    Planned procedures: Proceed with colonoscopy. The patient understands the nature of the  planned procedure, indications, risks, alternatives and potential complications including but not limited to bleeding, infection, perforation, damage to internal organs and possible oversedation/side effects from anesthesia. The patient agrees and gives consent to proceed.  Please refer to procedure notes for findings, recommendations and patient disposition/instructions.     Belton Peplinski [name], M.D. Gastroenterology 05/04/2021  1:30 PM

## 2021-05-04 NOTE — Interval H&P Note (Signed)
History and Physical Interval Note:  05/04/2021 1:31 PM  Carmen Gonzalez  has presented today for surgery, with the diagnosis of hx of adenomatous colonic polyp.  The various methods of treatment have been discussed with the patient and family. After consideration of risks, benefits and other options for treatment, the patient has consented to  Procedure(s): COLONOSCOPY WITH PROPOFOL (N/A) as a surgical intervention.  The patient's history has been reviewed, patient examined, no change in status, stable for surgery.  I have reviewed the patient's chart and labs.  Questions were answered to the patient's satisfaction.     [place], [place]

## 2021-05-04 NOTE — Transfer of Care (Signed)
Immediate Anesthesia Transfer of Care Note  Patient: Carmen Gonzalez  Procedure(s) Performed: COLONOSCOPY WITH PROPOFOL  Patient Location: PACU  Anesthesia Type:General  Level of Consciousness: awake  Airway & Oxygen Therapy: Patient Spontanous Breathing and Patient connected to nasal cannula oxygen  Post-op Assessment: Report given to RN and Post -op Vital signs reviewed and stable  Post vital signs: Reviewed and stable  Last Vitals:  Vitals Value Taken Time  BP 117/63 05/04/21 1407  Temp    Pulse 88 05/04/21 1407  Resp 18 05/04/21 1407  SpO2 91 % 05/04/21 1407  Vitals shown include unvalidated device data.  Last Pain:  Vitals:   05/04/21 1244  PainSc: 0-No pain      Patients Stated Pain Goal: 0 (51/46/04 7998)  Complications: No notable events documented.

## 2021-05-04 NOTE — Op Note (Signed)
[facility] Gastroenterology Patient Name: Carmen Gonzalez Procedure Date: 05/04/2021 1:47 PM MRN: [id number] Account #: [id number] Date of Birth: March 12, 1951 Admit Type: Outpatient Age: 70 Room: [facility] ENDO ROOM 2 Gender: Female Note Status: Finalized Procedure:             Colonoscopy Indications:           Surveillance: Personal history of adenomatous polyps                         on last colonoscopy > 5 years ago Providers:             Lorie Apley [name] MD, MD Referring MD:          Precious Bard, MD (Referring MD) Medicines:             Propofol per Anesthesia Complications:         No immediate complications. Procedure:             Pre-Anesthesia Assessment:                        - The risks and benefits of the procedure and the                         sedation options and risks were discussed with the                         patient. All questions were answered and informed                         consent was obtained.                        - Patient identification and proposed procedure were                         verified prior to the procedure by the nurse. The                         procedure was verified in the procedure room.                        - ASA Grade Assessment: III - A patient with severe                         systemic disease.                        - After reviewing the risks and benefits, the patient                         was deemed in satisfactory condition to undergo the                         procedure.                        After obtaining informed consent, the colonoscope was                         passed under direct  vision. Throughout the procedure,                         the patient's blood pressure, pulse, and oxygen                         saturations were monitored continuously. The                         Colonoscope was introduced through the anus and                         advanced to the the cecum,  identified by appendiceal                         orifice and ileocecal valve. The colonoscopy was                         performed without difficulty. The patient tolerated                         the procedure well. The quality of the bowel                         preparation was excellent. The ileocecal valve,                         appendiceal orifice, and rectum were photographed. Findings:      The perianal and digital rectal examinations were normal. Pertinent       negatives include normal sphincter tone and no palpable rectal lesions.      Non-bleeding internal hemorrhoids were found during retroflexion. The       hemorrhoids were Grade I (internal hemorrhoids that do not prolapse).      Three sessile polyps were found in the rectum. The polyps were       diminutive in size. These polyps were removed with a cold biopsy       forceps. Resection and retrieval were complete.      The exam was otherwise without abnormality. Impression:            - Non-bleeding internal hemorrhoids.                        - Three diminutive polyps in the rectum, removed with                         a cold biopsy forceps. Resected and retrieved.                        - The examination was otherwise normal. Recommendation:        - Patient has a contact number available for                         emergencies. The signs and symptoms of potential                         delayed complications were discussed with the patient.  Return to normal activities tomorrow. Written                         discharge instructions were provided to the patient.                        - Resume previous diet.                        - Continue present medications.                        - If polyps are benign or adenomatous without                         dysplasia, I will advise NO further colonoscopy due to                         advanced age and/or severe comorbidity.                         - Return to GI office PRN.                        - The findings and recommendations were discussed with                         the patient. Procedure Code(s):     --- Professional ---                        [phone], Colonoscopy, flexible; with biopsy, single or                         multiple Diagnosis Code(s):     --- Professional ---                        K64.0, First degree hemorrhoids                        K62.1, Rectal polyp                        Z86.010, Personal history of colonic polyps CPT copyright 2019 [employer]. All rights reserved. The codes documented in this report are preliminary and upon coder review may  be revised to meet current compliance requirements. Efrain Sella MD, MD 05/04/2021 2:05:59 PM This report has been signed electronically. Number of Addenda: 0 Note Initiated On: 05/04/2021 1:47 PM Scope Withdrawal Time: 0 hours 7 minutes 50 seconds  Total Procedure Duration: 0 hours 11 minutes 15 seconds  Estimated Blood Loss:  Estimated blood loss: none.      [facility]

## 2021-05-04 NOTE — Anesthesia Preprocedure Evaluation (Signed)
Anesthesia Evaluation  Patient identified by MRN, date of birth, ID band Patient awake    Reviewed: Allergy & Precautions, NPO status , Patient's Chart, lab work & pertinent test results  Airway Mallampati: III  TM Distance: <3 FB Neck ROM: Full    Dental  (+) Caps, Chipped   Pulmonary asthma , COPD,  COPD inhaler, former smoker,    Pulmonary exam normal breath sounds clear to auscultation       Cardiovascular hypertension, Pt. on medications Normal cardiovascular exam     Neuro/Psych negative neurological ROS  negative psych ROS   GI/Hepatic negative GI ROS, Neg liver ROS,   Endo/Other  negative endocrine ROSdiabetes, Well Controlled  Renal/GU negative Renal ROS  negative genitourinary   Musculoskeletal  (+) Arthritis , Osteoarthritis,    Abdominal Normal abdominal exam  (+)   Peds negative pediatric ROS (+)  Hematology negative hematology ROS (+)   Anesthesia Other Findings . Abnormal cytology  had cryo  . Allergy  . Arthritis 2018  . Asthma  . Cataract cortical, senile 2018  . COPD (chronic obstructive pulmonary disease) (CMS-HCC)  . Deviated septum  . Diabetes mellitus type 2, uncomplicated (CMS-HCC)  . Glaucoma (increased eye pressure)  . Hypercholesterolemia  . Hypertension  . Obesity  . Torn meniscus  . Tuberculosis 1979     Reproductive/Obstetrics                             Anesthesia Physical  Anesthesia Plan  ASA: 3  Anesthesia Plan: General   Post-op Pain Management:    Induction: Intravenous  PONV Risk Score and Plan: 2 and Propofol infusion and TIVA  Airway Management Planned: Nasal Cannula and Natural Airway  Additional Equipment:   Intra-op Plan:   Post-operative Plan:   Informed Consent: I have reviewed the patients History and Physical, chart, labs and discussed the procedure including the risks, benefits and alternatives for the proposed  anesthesia with the patient or authorized representative who has indicated his/her understanding and acceptance.     Dental advisory given  Plan Discussed with: CRNA, Surgeon and Anesthesiologist  Anesthesia Plan Comments:         Anesthesia Quick Evaluation

## 2021-05-04 NOTE — Anesthesia Postprocedure Evaluation (Signed)
Anesthesia Post Note  Patient: Carmen Gonzalez  Procedure(s) Performed: COLONOSCOPY WITH PROPOFOL  Patient location during evaluation: Phase II Anesthesia Type: General Level of consciousness: awake and alert, awake and oriented Pain management: pain level controlled Vital Signs Assessment: post-procedure vital signs reviewed and stable Respiratory status: spontaneous breathing, nonlabored ventilation and respiratory function stable Cardiovascular status: blood pressure returned to baseline and stable Postop Assessment: no apparent nausea or vomiting Anesthetic complications: no   No notable events documented.   Last Vitals:  Vitals:   05/04/21 1407 05/04/21 1417  BP:  (!) 122/94  Pulse:    Resp:    Temp: (!) 36.2 C   SpO2: 92%     Last Pain:  Vitals:   05/04/21 1427  TempSrc:   PainSc: 0-No pain                 Phill Mutter

## 2021-05-05 ENCOUNTER — Encounter: Payer: Self-pay | Admitting: Internal Medicine

## 2021-05-06 LAB — SURGICAL PATHOLOGY

## 2021-10-31 ENCOUNTER — Ambulatory Visit: Payer: Medicare HMO | Attending: Neurology | Admitting: Speech Pathology

## 2021-10-31 ENCOUNTER — Other Ambulatory Visit: Payer: Self-pay

## 2021-10-31 DIAGNOSIS — F0280 Dementia in other diseases classified elsewhere without behavioral disturbance: Secondary | ICD-10-CM | POA: Diagnosis present

## 2021-10-31 DIAGNOSIS — G3101 Pick's disease: Secondary | ICD-10-CM | POA: Insufficient documentation

## 2021-10-31 DIAGNOSIS — R4701 Aphasia: Secondary | ICD-10-CM | POA: Diagnosis not present

## 2021-11-01 NOTE — Therapy (Signed)
[place] MAIN [facility] SERVICES [address] [place], [place], [address] Phone: [phone]   Fax:  [phone]  Speech Language Pathology Evaluation  Patient Details  Name: Carmen Gonzalez MRN: [id number] Date of Birth: 10-15-1951 Referring Provider (SLP): Dr Jennings Books   Encounter Date: 10/31/2021   End of Session - 11/01/21 1206     Visit Number 1    Number of Visits 25    Date for SLP Re-Evaluation 01/24/22    Authorization Type Medicare    Authorization Time Period 10/31/2021 thru 03/0/2023    Authorization - Visit Number 1    Progress Note Due on Visit 10    SLP Start Time 0900    SLP Stop Time  1000    SLP Time Calculation (min) 60 min    Activity Tolerance Patient tolerated treatment well             Past Medical History:  Diagnosis Date   Allergy    Arthritis    Asthma    Cataract cortical, senile    COPD (chronic obstructive pulmonary disease) ([place])    Deviated septum    Diabetes mellitus without complication ([place])    Glaucoma    Hypercholesteremia    Hyperlipidemia    Hypertension    Obesity    Tuberculosis     Past Surgical History:  Procedure Laterality Date   COLONOSCOPY WITH PROPOFOL N/A 01/17/2016   Procedure: COLONOSCOPY WITH PROPOFOL;  Surgeon: Manya Silvas, MD;  Location: [place];  Service: Endoscopy;  Laterality: N/A;   COLONOSCOPY WITH PROPOFOL N/A 05/04/2021   Procedure: COLONOSCOPY WITH PROPOFOL;  Surgeon: Toledo, Benay Pike, MD;  Location: [facility];  Service: Gastroenterology;  Laterality: N/A;   CT ARTHROGRAM KNEE RT WO ([facility] HX)     NASAL FRACTURE SURGERY      There were no vitals filed for this visit.   Subjective Assessment - 11/01/21 1157     Subjective Pt appeared motivated but with flat affect    Currently in Pain? 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 Pt referred by Dr Jennings Books (10/05/2021) d/t worsening word finding deficits. Most recent course of ST, imaging and testing are from 2020.    Behavioral/Cognition flat affect, appropriate    Mobility Status ambulatory      Balance Screen   Has the patient fallen in the past 6 months No    Has the patient had a decrease in activity level because of a fear of falling?  No    Is the patient reluctant to leave their home because of a fear of falling?  No      Prior Functional Status   Cognitive/Linguistic Baseline Within functional limits    Type of Home House     Lives With Spouse    Available Support Family      Pain Assessment   Pain Assessment No/denies pain      Cognition  Overall Cognitive Status Difficult to assess    Difficult to assess due to Impaired communication    Awareness Appears intact    Problem Solving Appears intact      Auditory Comprehension   Overall Auditory Comprehension Appears within functional limits for tasks assessed      Reading Comprehension   Reading Status Within funtional limits      Expression   Primary Mode of Expression Verbal      Verbal Expression   Overall Verbal Expression Impaired    Initiation Impaired    Automatic Speech Name;Social Response    Level of Generative/Spontaneous Verbalization Phrase    Repetition Impaired    Level of Impairment Word level    Naming Impairment    Responsive 76-100% accurate    Confrontation 75-100% accurate    Convergent 75-100% accurate    Divergent 0-24% accurate    Verbal Errors Semantic paraphasias;Phonemic paraphasias;Aware of errors;Consistent    Pragmatics Impairment    Impairments Abnormal  affect;Dysprosody;Monotone    Effective Techniques Semantic cues;Phonemic cues;Sentence completion    Non-Verbal Means of Communication Not applicable      Written Expression   Dominant Hand Right    Written Expression Within Functional Limits      Oral Motor/Sensory Function   Overall Oral Motor/Sensory Function Appears within functional limits for tasks assessed      Motor Speech   Overall Motor Speech Appears within functional limits for tasks assessed      Standardized Assessments   Standardized Assessments  [place] Naming Test-2nd edition                             SLP Education - 11/01/21 1205     Education Details results of assessment, Text to Speech app, possible dysphonia, ST POC    Person(s) Educated Patient    Methods Explanation;Demonstration;Verbal cues;Handout    Comprehension Verbalized understanding;Returned demonstration;Need further instruction              SLP Short Term Goals - 11/01/21 1317       SLP SHORT TERM GOAL #1   Title Pt with demonstrate independent understanding of a "Text to Talk" App by demonstrating use of 3 features within app.    Baseline new goal    Time 10    Period --   sessions   Status New      SLP SHORT TERM GOAL #2   Title Pt will use a "Text to Talk" app to respond to semi-complex questions in 5 out of 6 opportunities.    Baseline new goal    Time 10    Period --   sessions     SLP SHORT TERM GOAL #3   Title Pt and her husband will create list of 10 functional phrases (scripts) to increase automaticity of daily communication.    Baseline new goal    Time 10    Period --   sessions             SLP Long Term Goals - 11/01/21 1320       SLP LONG TERM GOAL #1   Title To maximize functional communication across all communication contexts.    Baseline new goal    Time 12    Period Weeks    Status New    Target Date 01/24/22              Plan - 11/01/21 1211  Clinical Impression  Statement Pt presents with moderate nonfluent expressive communication impairment that is c/b severe word finding deficits resulting in 1-2 word utterances, semantic paraphasias, occasional phonemic paraphasias, self-awareness of errors with moderate ability to self-correct. Her word findings deficits are c/b inability to generative name semantic or phonemic items. She is however able to complete confrontational, responsive, convergent, associations and opposite vocabulary tasks.  Pt's overall affect is flat and her communication lacks prosody and producing multi-syllabic words results in static like production. Pt's receptive language abilities appear largely intact with yes/no responses occasionally incorrect d/t expressive language deficits which pt immediately self-corrects. Pt's ability to write and read also appear largely intact with pt demonstrating good ability to use Text to Speech app to generate trial sentence. In addition to pt's Primary Progressive Aphasia, she presents with moderate hoarseness and habitual throat clearing. Will further investigate possible referral to ENT. Skilled ST intervention is required to train pt in use of AAC as well as instruct pt in ways to increase her expressive communication to increase her functional independence and reduce caregiver burden.    Speech Therapy Frequency 2x / week    Duration 12 weeks    Treatment/Interventions Language facilitation;Patient/family education;Compensatory strategies;SLP instruction and feedback    Potential to Achieve Goals Good    Potential Considerations Severity of impairments;Medical prognosis    Consulted and Agree with Plan of Care Patient             Patient will benefit from skilled therapeutic intervention in order to improve the following deficits and impairments:   Aphasia  Primary progressive aphasia ([place])    Problem List There are no problems to display for this patient.  Terry Bolotin B. Rutherford Nail M.S., CCC-SLP,  [facility] Speech-Language Pathologist Rehabilitation Services Office [phone], [place] 11/01/2021, 1:21 PM  [place] MAIN [facility] SERVICES [address] [place], [place], [address] Phone: [phone]   Fax:  [phone]  Name: Carmen Gonzalez MRN: [id number] Date of Birth: 1951-02-23

## 2021-11-03 ENCOUNTER — Other Ambulatory Visit: Payer: Self-pay

## 2021-11-03 ENCOUNTER — Ambulatory Visit: Payer: Medicare HMO | Admitting: Speech Pathology

## 2021-11-03 DIAGNOSIS — G3101 Pick's disease: Secondary | ICD-10-CM

## 2021-11-03 DIAGNOSIS — R4701 Aphasia: Secondary | ICD-10-CM

## 2021-11-03 DIAGNOSIS — F0280 Dementia in other diseases classified elsewhere without behavioral disturbance: Secondary | ICD-10-CM

## 2021-11-04 ENCOUNTER — Other Ambulatory Visit: Payer: Self-pay | Admitting: Physician Assistant

## 2021-11-04 DIAGNOSIS — Z1231 Encounter for screening mammogram for malignant neoplasm of breast: Secondary | ICD-10-CM

## 2021-11-04 NOTE — Patient Instructions (Signed)
Practice Text to Speech App

## 2021-11-04 NOTE — Therapy (Signed)
[place] MAIN [facility] SERVICES [address] [place], [place], [address] Phone: [phone]   Fax:  [phone]  Speech Language Pathology Treatment  Patient Details  Name: Carmen Gonzalez MRN: [id number] Date of Birth: 04-14-1951 Referring Provider (SLP): Dr Jennings Books   Encounter Date: 11/03/2021   End of Session - 11/04/21 1236     Visit Number 2    Number of Visits 25    Date for SLP Re-Evaluation 01/24/22    Authorization Type Medicare    Authorization Time Period 10/31/2021 thru 03/0/2023    Authorization - Visit Number 2    Progress Note Due on Visit 10    SLP Start Time 1500    SLP Stop Time  1600    SLP Time Calculation (min) 60 min    Activity Tolerance Patient tolerated treatment well             Past Medical History:  Diagnosis Date   Allergy    Arthritis    Asthma    Cataract cortical, senile    COPD (chronic obstructive pulmonary disease) ([place])    Deviated septum    Diabetes mellitus without complication ([place])    Glaucoma    Hypercholesteremia    Hyperlipidemia    Hypertension    Obesity    Tuberculosis     Past Surgical History:  Procedure Laterality Date   COLONOSCOPY WITH PROPOFOL N/A 01/17/2016   Procedure: COLONOSCOPY WITH PROPOFOL;  Surgeon: Manya Silvas, MD;  Location: [place];  Service: Endoscopy;  Laterality: N/A;   COLONOSCOPY WITH PROPOFOL N/A 05/04/2021   Procedure: COLONOSCOPY WITH PROPOFOL;  Surgeon: Toledo, Benay Pike, MD;  Location: [facility];  Service: Gastroenterology;  Laterality: N/A;   CT ARTHROGRAM KNEE RT WO ([facility] HX)     NASAL FRACTURE SURGERY      There were no vitals filed for this visit.   Subjective Assessment - 11/04/21 1234     Subjective pt pleasant, remembered to look up her Apple ID    Currently in Pain? No/denies                   ADULT SLP TREATMENT - 11/04/21 0001       Treatment Provided   Treatment provided Cognitive-Linquistic       Cognitive-Linquistic Treatment   Treatment focused on Aphasia;Cognition;Patient/family/caregiver education    Skilled Treatment Introduced and began instruction in Text to Speech app on pt's phone; Provided printed instructed on creating utterances, folders to contain utterances, generated topics that might be useful to have utterances preprogramed for. SLP further facilitated session identifying communication breakdown in pt's ability to describe family relationships. Pt cued to provide alternate words to aid in listener understanding but pt unable to write down the words or generate alternate words.              SLP Education - 11/04/21 1235     Education Details strategies to aid in listener understanding during communication breakdowns    Person(s) Educated Patient    Methods Explanation;Demonstration;Verbal cues;Handout    Comprehension Verbalized understanding;Returned demonstration;Need further instruction              SLP Short Term Goals - 11/01/21 1317       SLP SHORT TERM GOAL #1   Title Pt with demonstrate independent understanding of a "Text to Talk" App by demonstrating use of 3 features within app.    Baseline new goal    Time 10  Period --   sessions   Status New      SLP SHORT TERM GOAL #2   Title Pt will use a "Text to Talk" app to respond to semi-complex questions in 5 out of 6 opportunities.    Baseline new goal    Time 10    Period --   sessions     SLP SHORT TERM GOAL #3   Title Pt and her husband will create list of 10 functional phrases (scripts) to increase automaticity of daily communication.    Baseline new goal    Time 10    Period --   sessions             SLP Long Term Goals - 11/01/21 1320       SLP LONG TERM GOAL #1   Title To maximize functional communication across all communication contexts.    Baseline new goal    Time 12    Period Weeks    Status New    Target Date 01/24/22              Plan - 11/04/21  1236     Clinical Impression Statement Pt presents with frequent communication breakdowns d/t word finding deficits and speech intelligibility deficits. Pt unable to access strategies to improve communication during these instances. Skilled ST intervention is required to provide and train pt in strategies to improve communication during moments of breakdown for independent communication with people within the community.    Speech Therapy Frequency 2x / week    Duration 12 weeks    Treatment/Interventions Language facilitation;Patient/family education;Compensatory strategies;SLP instruction and feedback    Potential to Achieve Goals Good    Potential Considerations Severity of impairments;Medical prognosis    Consulted and Agree with Plan of Care Patient             Patient will benefit from skilled therapeutic intervention in order to improve the following deficits and impairments:   Aphasia  Primary progressive aphasia ([place])    Problem List There are no problems to display for this patient.  Carmen GonzalezS., CCC-SLP, [facility] Speech-Language Pathologist Rehabilitation Services Office [place], [place] 11/04/2021, 12:37 PM  [place] MAIN [facility] SERVICES [address] [place], [place], [address] Phone: [phone]   Fax:  [phone]   Name: Carmen Gonzalez MRN: [id number] Date of Birth: 17-Jun-1951

## 2021-11-08 ENCOUNTER — Ambulatory Visit: Payer: Medicare HMO | Admitting: Speech Pathology

## 2021-11-08 ENCOUNTER — Other Ambulatory Visit: Payer: Self-pay

## 2021-11-08 DIAGNOSIS — G3101 Pick's disease: Secondary | ICD-10-CM

## 2021-11-08 DIAGNOSIS — R4701 Aphasia: Secondary | ICD-10-CM

## 2021-11-09 ENCOUNTER — Encounter: Payer: Medicare HMO | Admitting: Speech Pathology

## 2021-11-09 NOTE — Patient Instructions (Signed)
Complete semantic feature analysis for 2 additional words

## 2021-11-09 NOTE — Therapy (Signed)
[place] MAIN [facility] SERVICES [address] [place], [place], [address] Phone: [phone]   Fax:  [phone]  Speech Language Pathology Treatment  Patient Details  Name: Carmen Gonzalez MRN: [id number] Date of Birth: January 08, 1951 Referring Provider (SLP): Dr Jennings Books   Encounter Date: 11/08/2021   End of Session - 11/09/21 1030     Visit Number 3    Number of Visits 25    Date for SLP Re-Evaluation 01/24/22    Authorization Type Medicare    Authorization Time Period 10/31/2021 thru 03/0/2023    Authorization - Visit Number 3    Progress Note Due on Visit 10    SLP Start Time 0800    SLP Stop Time  0900    SLP Time Calculation (min) 60 min    Activity Tolerance Patient tolerated treatment well             Past Medical History:  Diagnosis Date   Allergy    Arthritis    Asthma    Cataract cortical, senile    COPD (chronic obstructive pulmonary disease) ([place])    Deviated septum    Diabetes mellitus without complication ([place])    Glaucoma    Hypercholesteremia    Hyperlipidemia    Hypertension    Obesity    Tuberculosis     Past Surgical History:  Procedure Laterality Date   COLONOSCOPY WITH PROPOFOL N/A 01/17/2016   Procedure: COLONOSCOPY WITH PROPOFOL;  Surgeon: Manya Silvas, MD;  Location: [place];  Service: Endoscopy;  Laterality: N/A;   COLONOSCOPY WITH PROPOFOL N/A 05/04/2021   Procedure: COLONOSCOPY WITH PROPOFOL;  Surgeon: Toledo, Benay Pike, MD;  Location: [facility];  Service: Gastroenterology;  Laterality: N/A;   CT ARTHROGRAM KNEE RT WO ([facility] HX)     NASAL FRACTURE SURGERY      There were no vitals filed for this visit.   Subjective Assessment - 11/09/21 1025     Subjective "short answers are best"    Currently in Pain? No/denies                   ADULT SLP TREATMENT - 11/09/21 0001       Treatment Provided   Treatment provided Cognitive-Linquistic       Cognitive-Linquistic Treatment   Treatment focused on Aphasia;Cognition;Patient/family/caregiver education    Skilled Treatment SLP trained semantic feature analysis (SFA) with visual aid. Initial training with SLP provided x5 words related to family/hobbies, patient provided x3 features with moderate contextual/questions cues to verbalize features. Patient improved to provided total x3 features in all opportunities with moderate assistance via questions and visual cues to provide a facilitator (feature) word for word finding. SLP modeled SFA and provided x3 features for patient to complete word finding--100% accuracy demonstrated.              SLP Education - 11/09/21 1030     Education Details semantic feature analysis    Person(s) Educated Patient    Methods Explanation;Demonstration;Verbal cues;Handout    Comprehension Verbalized understanding;Need further instruction              SLP Short Term Goals - 11/01/21 1317       SLP SHORT TERM GOAL #1   Title Pt with demonstrate independent understanding of a "Text to Talk" App by demonstrating use of 3 features within app.    Baseline new goal    Time 10    Period --   sessions  Status New      SLP SHORT TERM GOAL #2   Title Pt will use a "Text to Talk" app to respond to semi-complex questions in 5 out of 6 opportunities.    Baseline new goal    Time 10    Period --   sessions     SLP SHORT TERM GOAL #3   Title Pt and her husband will create list of 10 functional phrases (scripts) to increase automaticity of daily communication.    Baseline new goal    Time 10    Period --   sessions             SLP Long Term Goals - 11/01/21 1320       SLP LONG TERM GOAL #1   Title To maximize functional communication across all communication contexts.    Baseline new goal    Time 12    Period Weeks    Status New    Target Date 01/24/22              Plan - 11/09/21 1030     Clinical Impression Statement Pt  presents with mild to moderate word finding abilities, grossly functional reading deficits and writing deficits. Session today targeted training for semantic feature analysis in alliance with patient directed goals/areas of frustration: word finding. Patient with improved carryover of strategy with continued tasks during session as above though requires moderate cues/questions to achieve >1 feature to facilitate independence in word finding. While pt is able to voice understanding and demonstration of semantic feature analysis, she has significant difficulty with application. Skilled ST intervention is required to further assess pt's cognitive communication abilities, target his overall language deficits to increase pt's functional independence and reduce caregiver    Speech Therapy Frequency 2x / week    Duration 12 weeks    Treatment/Interventions Language facilitation;Patient/family education;Compensatory strategies;SLP instruction and feedback    Potential to Achieve Goals Good    Potential Considerations Severity of impairments;Medical prognosis    SLP Home Exercise Plan provided, see pt instructions section    Consulted and Agree with Plan of Care Patient             Patient will benefit from skilled therapeutic intervention in order to improve the following deficits and impairments:   Aphasia  Primary progressive aphasia ([place])    Problem List There are no problems to display for this patient.  Carmen Gonzalez Nail M.S., CCC-SLP, [facility] Pathologist Rehabilitation Services Office [phone]  [place] Fabian, [place] 11/09/2021, 10:33 AM  [place] MAIN [facility] SERVICES [address] [place], [place], [address] Phone: [phone]   Fax:  [phone]   Name: Carmen Gonzalez MRN: [id number] Date of Birth: Jul 06, 1951

## 2021-11-10 ENCOUNTER — Encounter: Payer: Medicare HMO | Admitting: Speech Pathology

## 2021-11-11 ENCOUNTER — Ambulatory Visit: Payer: Medicare HMO | Admitting: Speech Pathology

## 2021-11-11 ENCOUNTER — Other Ambulatory Visit: Payer: Self-pay

## 2021-11-11 DIAGNOSIS — R4701 Aphasia: Secondary | ICD-10-CM | POA: Diagnosis not present

## 2021-11-11 DIAGNOSIS — G3101 Pick's disease: Secondary | ICD-10-CM

## 2021-11-11 NOTE — Patient Instructions (Signed)
Use gestures, drawing, writing and alternate words during moments of word finding difficulty

## 2021-11-11 NOTE — Therapy (Signed)
[place] MAIN [facility] SERVICES [address] [place], [place], [address] Phone: [phone]   Fax:  [phone]  Speech Language Pathology Treatment  Patient Details  Name: Carmen Gonzalez MRN: [id number] Date of Birth: 1951-11-16 Referring Provider (SLP): Dr Jennings Books   Encounter Date: 11/11/2021   End of Session - 11/11/21 1217     Visit Number 4    Number of Visits 25    Date for SLP Re-Evaluation 01/24/22    Authorization Type Medicare    Authorization Time Period 10/31/2021 thru 03/0/2023    Authorization - Visit Number 4    Progress Note Due on Visit 10    SLP Start Time 0930    SLP Stop Time  1030    SLP Time Calculation (min) 60 min    Activity Tolerance Patient tolerated treatment well             Past Medical History:  Diagnosis Date   Allergy    Arthritis    Asthma    Cataract cortical, senile    COPD (chronic obstructive pulmonary disease) ([place])    Deviated septum    Diabetes mellitus without complication ([place])    Glaucoma    Hypercholesteremia    Hyperlipidemia    Hypertension    Obesity    Tuberculosis     Past Surgical History:  Procedure Laterality Date   COLONOSCOPY WITH PROPOFOL N/A 01/17/2016   Procedure: COLONOSCOPY WITH PROPOFOL;  Surgeon: Manya Silvas, MD;  Location: [place];  Service: Endoscopy;  Laterality: N/A;   COLONOSCOPY WITH PROPOFOL N/A 05/04/2021   Procedure: COLONOSCOPY WITH PROPOFOL;  Surgeon: Toledo, Benay Pike, MD;  Location: [facility];  Service: Gastroenterology;  Laterality: N/A;   CT ARTHROGRAM KNEE RT WO ([facility] HX)     NASAL FRACTURE SURGERY      There were no vitals filed for this visit.   Subjective Assessment - 11/11/21 1215     Subjective pt pleasant    Currently in Pain? No/denies                   ADULT SLP TREATMENT - 11/11/21 0001       Treatment Provided   Treatment provided Cognitive-Linquistic      Cognitive-Linquistic Treatment    Treatment focused on Aphasia;Cognition;Patient/family/caregiver education    Skilled Treatment Skilled treatment session focused on response elaboration to increase length of utterances as well as prosody/fluency. Pt was Mod I with sentence creation and fluency with sentences ranging from 5 to 7 words. Much improved over previous sessions.              SLP Education - 11/11/21 1217     Education Details response elaboration, strategies to utilize during word finding difficulty    Person(s) Educated Patient    Methods Explanation;Demonstration;Verbal cues    Comprehension Verbalized understanding;Returned demonstration              SLP Short Term Goals - 11/01/21 1317       SLP SHORT TERM GOAL #1   Title Pt with demonstrate independent understanding of a "Text to Talk" App by demonstrating use of 3 features within app.    Baseline new goal    Time 10    Period --   sessions   Status New      SLP SHORT TERM GOAL #2   Title Pt will use a "Text to Talk" app to respond to semi-complex questions in 5 out  of 6 opportunities.    Baseline new goal    Time 10    Period --   sessions     SLP SHORT TERM GOAL #3   Title Pt and her husband will create list of 10 functional phrases (scripts) to increase automaticity of daily communication.    Baseline new goal    Time 10    Period --   sessions             SLP Long Term Goals - 11/01/21 1320       SLP LONG TERM GOAL #1   Title To maximize functional communication across all communication contexts.    Baseline new goal    Time 12    Period Weeks    Status New    Target Date 01/24/22              Plan - 11/11/21 1217     Clinical Impression Statement Pt presents with mild to moderate word finding abilities, grossly functional reading deficits and writing deficits. Session today targeted training for semantic feature analysis in alliance with patient directed goals/areas of frustration: word finding. Patient with  improved carryover of strategy with continued tasks during session as above though requires moderate cues/questions to achieve >1 feature to facilitate independence in word finding. While pt is able to voice understanding and demonstration of semantic feature analysis, she has significant difficulty with application. Skilled ST intervention is required to further assess pt's cognitive communication abilities, target his overall language deficits to increase pt's functional independence and reduce caregiver    Speech Therapy Frequency 2x / week    Duration 12 weeks    Treatment/Interventions Language facilitation;Patient/family education;Compensatory strategies;SLP instruction and feedback    Potential Considerations Severity of impairments;Medical prognosis    SLP Home Exercise Plan provided, see pt instructions section    Consulted and Agree with Plan of Care Patient             Patient will benefit from skilled therapeutic intervention in order to improve the following deficits and impairments:   Aphasia  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  Lillianah Swartzentruber B. Rutherford Nail M.S., CCC-SLP, [facility] Pathologist Rehabilitation Services Office [place], [place] 11/11/2021, 12:18 PM  [place] MAIN [facility] SERVICES [address] [place], [place], [address] Phone: [phone]   Fax:  [phone]   Name: Carmen Gonzalez MRN: [id number] Date of Birth: Oct 11, 1951

## 2021-11-15 ENCOUNTER — Ambulatory Visit: Payer: Medicare HMO | Admitting: Speech Pathology

## 2021-11-17 ENCOUNTER — Ambulatory Visit: Payer: Medicare HMO | Admitting: Speech Pathology

## 2021-11-22 ENCOUNTER — Ambulatory Visit: Payer: Medicare HMO | Attending: Neurology | Admitting: Speech Pathology

## 2021-11-22 ENCOUNTER — Other Ambulatory Visit: Payer: Self-pay

## 2021-11-22 DIAGNOSIS — G3101 Pick's disease: Secondary | ICD-10-CM | POA: Diagnosis present

## 2021-11-22 DIAGNOSIS — R4701 Aphasia: Secondary | ICD-10-CM | POA: Insufficient documentation

## 2021-11-22 DIAGNOSIS — F0280 Dementia in other diseases classified elsewhere without behavioral disturbance: Secondary | ICD-10-CM | POA: Diagnosis present

## 2021-11-24 ENCOUNTER — Ambulatory Visit: Payer: Medicare HMO | Admitting: Speech Pathology

## 2021-11-24 ENCOUNTER — Other Ambulatory Visit: Payer: Self-pay

## 2021-11-24 DIAGNOSIS — F0280 Dementia in other diseases classified elsewhere without behavioral disturbance: Secondary | ICD-10-CM

## 2021-11-24 DIAGNOSIS — R4701 Aphasia: Secondary | ICD-10-CM | POA: Diagnosis not present

## 2021-11-24 NOTE — Therapy (Signed)
[place] MAIN [facility] SERVICES [address] [place], [place], [address] Phone: [phone]   Fax:  [phone]  Speech Language Pathology Treatment  Patient Details  Name: Carmen Gonzalez MRN: [id number] Date of Birth: 1951-10-20 Referring Provider (SLP): Dr Jennings Books   Encounter Date: 11/22/2021   End of Session - 11/24/21 0811     Visit Number 5    Number of Visits 25    Date for SLP Re-Evaluation 01/24/22    Authorization Type Medicare    Authorization Time Period 10/31/2021 thru 03/0/2023    Authorization - Visit Number 5    Progress Note Due on Visit 10    SLP Start Time 1300    SLP Stop Time  1400    SLP Time Calculation (min) 60 min    Activity Tolerance Patient tolerated treatment well             Past Medical History:  Diagnosis Date   Allergy    Arthritis    Asthma    Cataract cortical, senile    COPD (chronic obstructive pulmonary disease) ([place])    Deviated septum    Diabetes mellitus without complication ([place])    Glaucoma    Hypercholesteremia    Hyperlipidemia    Hypertension    Obesity    Tuberculosis     Past Surgical History:  Procedure Laterality Date   COLONOSCOPY WITH PROPOFOL N/A 01/17/2016   Procedure: COLONOSCOPY WITH PROPOFOL;  Surgeon: Manya Silvas, MD;  Location: [place];  Service: Endoscopy;  Laterality: N/A;   COLONOSCOPY WITH PROPOFOL N/A 05/04/2021   Procedure: COLONOSCOPY WITH PROPOFOL;  Surgeon: Toledo, Benay Pike, MD;  Location: [facility];  Service: Gastroenterology;  Laterality: N/A;   CT ARTHROGRAM KNEE RT WO ([facility] HX)     NASAL FRACTURE SURGERY      There were no vitals filed for this visit.   Subjective Assessment - 11/24/21 0810     Subjective "It is getting worse"    Currently in Pain? No/denies                   ADULT SLP TREATMENT - 11/24/21 0001       Treatment Provided   Treatment provided Cognitive-Linquistic      Cognitive-Linquistic  Treatment   Treatment focused on Aphasia;Patient/family/caregiver education   AAC Assessment   Skilled Treatment Completed Quick Assess for AAC via [place] app. PHYSICAL ASSESSMENT: Touch accuracy 100% for icons ranging large to small;Marland Kitchen Vision field test 100% accuracy; LANGUAGE ASSESSMENT: Phrase Completion - High; Categorization - High; Repetition - High, Confrontational Naming - High, Conversational Speech - low; DEVICE SIMULATION: Simple Simulation - Completed, Complex Simulation - Completed.  Specifically pt's conversational speech is c/b 1 word labeling of items contained within each picture (e.g., backyard, pool, flowers, rocks, swing, kitchen, sink refrigerator, oven, microwave, stove paper towels). Used clinic [place] with patient and she demonstrated ability to make semi-complex selections.              SLP Education - 11/24/21 0810     Education Details SGD; Adella Hare trial device    Person(s) Educated Patient    Methods Explanation;Demonstration;Verbal cues    Comprehension Verbalized understanding;Returned demonstration              SLP Short Term Goals - 11/01/21 1317       SLP SHORT TERM GOAL #1   Title Pt with demonstrate independent understanding of a "Text to Talk"  App by demonstrating use of 3 features within app.    Baseline new goal    Time 10    Period --   sessions   Status New      SLP SHORT TERM GOAL #2   Title Pt will use a "Text to Talk" app to respond to semi-complex questions in 5 out of 6 opportunities.    Baseline new goal    Time 10    Period --   sessions     SLP SHORT TERM GOAL #3   Title Pt and her husband will create list of 10 functional phrases (scripts) to increase automaticity of daily communication.    Baseline new goal    Time 10    Period --   sessions             SLP Long Term Goals - 11/01/21 1320       SLP LONG TERM GOAL #1   Title To maximize functional communication across all communication  contexts.    Baseline new goal    Time 12    Period Weeks    Status New    Target Date 01/24/22              Plan - 11/24/21 1093     Clinical Impression Statement Pt presents with worsening word finding abilities and her conversational speech is limited to 1-2 word utterances. Pt demosntrated great potential with SGD and voiced interested in trialing a device. Will consult [employer] and request trial device to customize for pt. Continue skilled ST to maximize functional communication and for use of AAC to communicate wants and needs.    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  Munirah Doerner B. Rutherford Nail M.S., CCC-SLP, [facility] Pathologist Rehabilitation Services Office [phone], [place] 11/24/2021, 8:13 AM  [place] MAIN [facility] SERVICES [address] [place], [place], [address] Phone: [phone]   Fax:  [phone]   Name: DONA WALBY MRN: [id number] Date of Birth: 1951-10-19

## 2021-11-25 NOTE — Therapy (Signed)
[place] MAIN [facility] SERVICES [address] [place], [place], [address] Phone: [phone]   Fax:  [phone]  Speech Language Pathology Treatment  Patient Details  Name: Carmen Gonzalez MRN: [id number] Date of Birth: 1950/12/03 Referring Provider (SLP): Dr Jennings Books   Encounter Date: 11/24/2021   End of Session - 11/25/21 1447     Visit Number 6    Number of Visits 25    Date for SLP Re-Evaluation 01/24/22    Authorization Type Medicare    Authorization Time Period 10/31/2021 thru 03/0/2023    Authorization - Visit Number 6    Progress Note Due on Visit 10    SLP Start Time 1015    SLP Stop Time  1115    SLP Time Calculation (min) 60 min    Activity Tolerance Patient tolerated treatment well             Past Medical History:  Diagnosis Date   Allergy    Arthritis    Asthma    Cataract cortical, senile    COPD (chronic obstructive pulmonary disease) ([place])    Deviated septum    Diabetes mellitus without complication ([place])    Glaucoma    Hypercholesteremia    Hyperlipidemia    Hypertension    Obesity    Tuberculosis     Past Surgical History:  Procedure Laterality Date   COLONOSCOPY WITH PROPOFOL N/A 01/17/2016   Procedure: COLONOSCOPY WITH PROPOFOL;  Surgeon: Manya Silvas, MD;  Location: [place];  Service: Endoscopy;  Laterality: N/A;   COLONOSCOPY WITH PROPOFOL N/A 05/04/2021   Procedure: COLONOSCOPY WITH PROPOFOL;  Surgeon: Toledo, Benay Pike, MD;  Location: [facility];  Service: Gastroenterology;  Laterality: N/A;   CT ARTHROGRAM KNEE RT WO ([facility] HX)     NASAL FRACTURE SURGERY      There were no vitals filed for this visit.   Subjective Assessment - 11/25/21 1441     Subjective Pt expressed concern with her husband's terminal cancer dx; "I need all the tools in my arsonal" referring to desire to get SGD to continue independent communication should she become widowed    Currently in Pain?  No/denies                   ADULT SLP TREATMENT - 11/25/21 0001       Treatment Provided   Treatment provided Cognitive-Linquistic      Cognitive-Linquistic Treatment   Treatment focused on Aphasia;Patient/family/caregiver education    Skilled Treatment Skiilled treatment session focused on creation of communication topics to put on SGD - mostly for pt to be able to express her husbands desires for end of life              SLP Education - 11/25/21 1446     Education Details SGD, [employer] trial device    Person(s) Educated Patient    Methods Explanation;Demonstration;Verbal cues    Comprehension Verbalized understanding;Returned demonstration              SLP Short Term Goals - 11/01/21 1317       SLP SHORT TERM GOAL #1   Title Pt with demonstrate independent understanding of a "Text to Talk" App by demonstrating use of 3 features within app.    Baseline new goal    Time 10    Period --   sessions   Status New      SLP SHORT TERM GOAL #2   Title Pt  will use a "Text to Talk" app to respond to semi-complex questions in 5 out of 6 opportunities.    Baseline new goal    Time 10    Period --   sessions     SLP SHORT TERM GOAL #3   Title Pt and her husband will create list of 10 functional phrases (scripts) to increase automaticity of daily communication.    Baseline new goal    Time 10    Period --   sessions             SLP Long Term Goals - 11/01/21 1320       SLP LONG TERM GOAL #1   Title To maximize functional communication across all communication contexts.    Baseline new goal    Time 12    Period Weeks    Status New    Target Date 01/24/22              Plan - 11/25/21 1447     Clinical Impression Statement Pt continues to express interest in SGD as she is fearful that if widowed, she will not be able to communicate independently. She expressed that her husband has cancer that has metatized and has a terminal dx. She  expresses desire to also be able to communicate his desires in the event that he nears death. Continue skilled St to maximize functional communication and for use of AAC to communicate wants and needs.    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  Joandy Burget B. Rutherford Nail M.S., CCC-SLP, [facility] Speech-Language Pathologist Rehabilitation Services Office [phone], [place] 11/25/2021, 2:50 PM  [place] MAIN [facility] SERVICES [address] [place], [place], [address] Phone: [phone]   Fax:  [phone]   Name: Carmen Gonzalez MRN: [id number] Date of Birth: 1951-04-19

## 2021-11-29 ENCOUNTER — Other Ambulatory Visit: Payer: Self-pay

## 2021-11-29 ENCOUNTER — Ambulatory Visit: Payer: Medicare HMO | Admitting: Speech Pathology

## 2021-11-29 DIAGNOSIS — R4701 Aphasia: Secondary | ICD-10-CM

## 2021-11-29 DIAGNOSIS — F0280 Dementia in other diseases classified elsewhere without behavioral disturbance: Secondary | ICD-10-CM

## 2021-11-29 DIAGNOSIS — G3101 Pick's disease: Secondary | ICD-10-CM

## 2021-11-30 NOTE — Patient Instructions (Signed)
Send pictures for SGD   Keep a reading journal while listening to audio book

## 2021-11-30 NOTE — Therapy (Signed)
[place] MAIN [facility] SERVICES [address] [place], [place], [address] Phone: [phone]   Fax:  [phone]  Speech Language Pathology Treatment  Patient Details  Name: Carmen Gonzalez MRN: [id number] Date of Birth: Apr 08, 1951 Referring Provider (SLP): Dr Jennings Books   Encounter Date: 11/29/2021   End of Session - 11/30/21 1527     Visit Number 7    Number of Visits 25    Date for SLP Re-Evaluation 01/24/22    Authorization Type Medicare    Authorization Time Period 10/31/2021 thru 03/0/2023    Authorization - Visit Number 7    Progress Note Due on Visit 10    SLP Start Time 1000    SLP Stop Time  1100    SLP Time Calculation (min) 60 min    Activity Tolerance Patient tolerated treatment well             Past Medical History:  Diagnosis Date   Allergy    Arthritis    Asthma    Cataract cortical, senile    COPD (chronic obstructive pulmonary disease) ([place])    Deviated septum    Diabetes mellitus without complication ([place])    Glaucoma    Hypercholesteremia    Hyperlipidemia    Hypertension    Obesity    Tuberculosis     Past Surgical History:  Procedure Laterality Date   COLONOSCOPY WITH PROPOFOL N/A 01/17/2016   Procedure: COLONOSCOPY WITH PROPOFOL;  Surgeon: Manya Silvas, MD;  Location: [place];  Service: Endoscopy;  Laterality: N/A;   COLONOSCOPY WITH PROPOFOL N/A 05/04/2021   Procedure: COLONOSCOPY WITH PROPOFOL;  Surgeon: Toledo, Benay Pike, MD;  Location: [facility];  Service: Gastroenterology;  Laterality: N/A;   CT ARTHROGRAM KNEE RT WO ([facility] HX)     NASAL FRACTURE SURGERY      There were no vitals filed for this visit.   Subjective Assessment - 11/30/21 1519     Subjective Pt pleasant but reports worsening ability to read    Currently in Pain? No/denies                   ADULT SLP TREATMENT - 11/30/21 0001       Treatment Provided   Treatment provided Cognitive-Linquistic       Cognitive-Linquistic Treatment   Treatment focused on Aphasia;Patient/family/caregiver education    Skilled Treatment Skilled treatment session focused on gathering information in preparation for pt's SGD; list made of items that pt needs to take pictures of - medicine bottles for herself, her husband, pets, children; Pt instructed in several apps that target spelling such as wordscapes. Pt required moderate cues for generating 4 words. SLP instructed pt to keep a reading journal while listening to audio books to help with recall of events; characters.              SLP Education - 11/30/21 1526     Education Details game apps; SGD    Person(s) Educated Patient    Methods Explanation;Demonstration;Verbal cues;Handout    Comprehension Verbalized understanding;Need further instruction              SLP Short Term Goals - 11/01/21 1317       SLP SHORT TERM GOAL #1   Title Pt with demonstrate independent understanding of a "Text to Talk" App by demonstrating use of 3 features within app.    Baseline new goal    Time 10    Period --  sessions   Status New      SLP SHORT TERM GOAL #2   Title Pt will use a "Text to Talk" app to respond to semi-complex questions in 5 out of 6 opportunities.    Baseline new goal    Time 10    Period --   sessions     SLP SHORT TERM GOAL #3   Title Pt and her husband will create list of 10 functional phrases (scripts) to increase automaticity of daily communication.    Baseline new goal    Time 10    Period --   sessions             SLP Long Term Goals - 11/01/21 1320       SLP LONG TERM GOAL #1   Title To maximize functional communication across all communication contexts.    Baseline new goal    Time 12    Period Weeks    Status New    Target Date 01/24/22              Plan - 11/30/21 1527     Clinical Impression Statement Pt continues to be highly motivated for using SGD and demonstrates good understanding of  clinic's loaner device. Pt's verbal expression is mildly improved as evidenced by slightly longer utterances (3-4 words). Skilled ST intervention is required to maximize functional communication and for use of AAC to communicate wants and needs in the setting of neurodegenerative diagnosis.    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  Jinger Middlesworth B. Rutherford Nail M.S., CCC-SLP, [facility] Pathologist Rehabilitation Services Office [phone]  Stormy Fabian, [place] 11/30/2021, 3:30 PM  [place] MAIN [facility] SERVICES [address] [place], [place], [address] Phone: [phone]   Fax:  [phone]   Name: Carmen Gonzalez MRN: [id number] Date of Birth: 02/03/1951

## 2021-12-01 ENCOUNTER — Other Ambulatory Visit: Payer: Self-pay

## 2021-12-01 ENCOUNTER — Ambulatory Visit: Payer: Medicare HMO | Admitting: Speech Pathology

## 2021-12-01 DIAGNOSIS — R4701 Aphasia: Secondary | ICD-10-CM | POA: Diagnosis not present

## 2021-12-01 DIAGNOSIS — G3101 Pick's disease: Secondary | ICD-10-CM

## 2021-12-01 DIAGNOSIS — F0280 Dementia in other diseases classified elsewhere without behavioral disturbance: Secondary | ICD-10-CM

## 2021-12-01 NOTE — Therapy (Signed)
[place] MAIN [facility] SERVICES [address] [place], [place], [address] Phone: [phone]   Fax:  [phone]  Speech Language Pathology Treatment  Patient Details  Name: Carmen Gonzalez MRN: [id number] Date of Birth: October 27, 1951 Referring Provider (SLP): Dr Jennings Books   Encounter Date: 12/01/2021   End of Session - 12/01/21 1153     Visit Number 8    Number of Visits 25    Date for SLP Re-Evaluation 01/24/22    Authorization Type Medicare    Authorization Time Period 10/31/2021 thru 03/0/2023    Authorization - Visit Number 8    Progress Note Due on Visit 10    SLP Start Time 0800    SLP Stop Time  0900    SLP Time Calculation (min) 60 min    Activity Tolerance Patient tolerated treatment well             Past Medical History:  Diagnosis Date   Allergy    Arthritis    Asthma    Cataract cortical, senile    COPD (chronic obstructive pulmonary disease) ([place])    Deviated septum    Diabetes mellitus without complication ([place])    Glaucoma    Hypercholesteremia    Hyperlipidemia    Hypertension    Obesity    Tuberculosis     Past Surgical History:  Procedure Laterality Date   COLONOSCOPY WITH PROPOFOL N/A 01/17/2016   Procedure: COLONOSCOPY WITH PROPOFOL;  Surgeon: Manya Silvas, MD;  Location: [place];  Service: Endoscopy;  Laterality: N/A;   COLONOSCOPY WITH PROPOFOL N/A 05/04/2021   Procedure: COLONOSCOPY WITH PROPOFOL;  Surgeon: Toledo, Benay Pike, MD;  Location: [facility];  Service: Gastroenterology;  Laterality: N/A;   CT ARTHROGRAM KNEE RT WO ([facility] HX)     NASAL FRACTURE SURGERY      There were no vitals filed for this visit.   Subjective Assessment - 12/01/21 1147     Subjective pt pleasant, "did you get all of my pictures?"    Currently in Pain? No/denies                   ADULT SLP TREATMENT - 12/01/21 0001       Treatment Provided   Treatment provided Cognitive-Linquistic       Cognitive-Linquistic Treatment   Treatment focused on Aphasia;Patient/family/caregiver education    Skilled Treatment Skilled treatment session focused on response elaboration to aid in verbal communication. With minimal cues, pt able to use grammatically correct sentences when looking thru photos to be placed on SGD. SLP also instructed pt in [place] therapy app with directions on downloading onto her iPad. Pt required more than a reasonable amount of time to achieve 90% correct on Sentence Scramble Level 4.              SLP Education - 12/01/21 1153     Education Details provided    [employer]) Educated Patient    Methods Explanation;Demonstration;Verbal cues;Handout    Comprehension Verbalized understanding;Need further instruction              SLP Short Term Goals - 11/01/21 1317       SLP SHORT TERM GOAL #1   Title Pt with demonstrate independent understanding of a "Text to Talk" App by demonstrating use of 3 features within app.    Baseline new goal    Time 10    Period --   sessions   Status New  SLP SHORT TERM GOAL #2   Title Pt will use a "Text to Talk" app to respond to semi-complex questions in 5 out of 6 opportunities.    Baseline new goal    Time 10    Period --   sessions     SLP SHORT TERM GOAL #3   Title Pt and her husband will create list of 10 functional phrases (scripts) to increase automaticity of daily communication.    Baseline new goal    Time 10    Period --   sessions             SLP Long Term Goals - 11/01/21 1320       SLP LONG TERM GOAL #1   Title To maximize functional communication across all communication contexts.    Baseline new goal    Time 12    Period Weeks    Status New    Target Date 01/24/22              Plan - 12/01/21 1153     Clinical Impression Statement Pt continues to be highly motivated for using SGD and demonstrates good understanding of clinic's loaner device. Pt's verbal expression is mildly  improved as evidenced by slightly longer utterances (3-4 words). Skilled ST intervention is required to maximize functional communication and for use of AAC to communicate wants and needs in the setting of nuerodegenerative diagnosis.    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  Bayan Kushnir B. Rutherford Nail M.S., CCC-SLP, [facility] Pathologist Rehabilitation Services Office [phone]  Stormy Fabian, [place] 12/01/2021, 11:54 AM  [place] MAIN [facility] SERVICES [address] [place], [place], [address] Phone: [phone]   Fax:  [phone]   Name: Carmen Gonzalez MRN: [id number] Date of Birth: September 07, 1951

## 2021-12-06 ENCOUNTER — Ambulatory Visit: Payer: Medicare HMO | Admitting: Speech Pathology

## 2021-12-06 ENCOUNTER — Other Ambulatory Visit: Payer: Self-pay

## 2021-12-06 DIAGNOSIS — G3101 Pick's disease: Secondary | ICD-10-CM

## 2021-12-06 DIAGNOSIS — R4701 Aphasia: Secondary | ICD-10-CM

## 2021-12-06 DIAGNOSIS — F0280 Dementia in other diseases classified elsewhere without behavioral disturbance: Secondary | ICD-10-CM

## 2021-12-07 NOTE — Patient Instructions (Signed)
Make list of allergies

## 2021-12-07 NOTE — Therapy (Signed)
[place] MAIN [facility] SERVICES [address] [place], [place], [address] Phone: [phone]   Fax:  [phone]  Speech Language Pathology Treatment  Patient Details  Name: Carmen Gonzalez MRN: [id number] Date of Birth: 1951-10-09 Referring Provider (SLP): Dr Jennings Books   Encounter Date: 12/06/2021   End of Session - 12/07/21 1607     Visit Number 9    Number of Visits 25    Date for SLP Re-Evaluation 01/24/22    Authorization Type Medicare    Authorization Time Period 10/31/2021 thru 03/0/2023    Authorization - Visit Number 9    Progress Note Due on Visit 10    SLP Start Time 1100    SLP Stop Time  1200    SLP Time Calculation (min) 60 min    Activity Tolerance Patient tolerated treatment well             Past Medical History:  Diagnosis Date   Allergy    Arthritis    Asthma    Cataract cortical, senile    COPD (chronic obstructive pulmonary disease) ([place])    Deviated septum    Diabetes mellitus without complication ([place])    Glaucoma    Hypercholesteremia    Hyperlipidemia    Hypertension    Obesity    Tuberculosis     Past Surgical History:  Procedure Laterality Date   COLONOSCOPY WITH PROPOFOL N/A 01/17/2016   Procedure: COLONOSCOPY WITH PROPOFOL;  Surgeon: Manya Silvas, MD;  Location: [place];  Service: Endoscopy;  Laterality: N/A;   COLONOSCOPY WITH PROPOFOL N/A 05/04/2021   Procedure: COLONOSCOPY WITH PROPOFOL;  Surgeon: Toledo, Benay Pike, MD;  Location: [facility];  Service: Gastroenterology;  Laterality: N/A;   CT ARTHROGRAM KNEE RT WO ([facility] HX)     NASAL FRACTURE SURGERY      There were no vitals filed for this visit.   Subjective Assessment - 12/07/21 1559     Subjective pt pleasant, excited about customizing the SGD    Currently in Pain? No/denies                   ADULT SLP TREATMENT - 12/07/21 0001       Treatment Provided   Treatment provided Cognitive-Linquistic       Cognitive-Linquistic Treatment   Treatment focused on Aphasia;Patient/family/caregiver education    Skilled Treatment Skilled treatment session focused on instructing pt in features on the Touch[employer]. Pt assisted in creating "my info" folder containing her name, DOB, address, phone number and emergency contact. Pt also able to direct SLP in creating more patient centered folders for ease of use.              SLP Education - 12/07/21 1607     Education Details customizing SGD - icon size, voice, rate of speech, icon behavior    Person(s) Educated Patient    Methods Explanation;Demonstration;Verbal cues    Comprehension Verbalized understanding;Need further instruction              SLP Short Term Goals - 11/01/21 1317       SLP SHORT TERM GOAL #1   Title Pt with demonstrate independent understanding of a "Text to Talk" App by demonstrating use of 3 features within app.    Baseline new goal    Time 10    Period --   sessions   Status New      SLP SHORT TERM GOAL #2   Title Pt  will use a "Text to Talk" app to respond to semi-complex questions in 5 out of 6 opportunities.    Baseline new goal    Time 10    Period --   sessions     SLP SHORT TERM GOAL #3   Title Pt and her husband will create list of 10 functional phrases (scripts) to increase automaticity of daily communication.    Baseline new goal    Time 10    Period --   sessions             SLP Long Term Goals - 11/01/21 1320       SLP LONG TERM GOAL #1   Title To maximize functional communication across all communication contexts.    Baseline new goal    Time 12    Period Weeks    Status New    Target Date 01/24/22              Plan - 12/07/21 1608     Clinical Impression Statement Pt presents with progressively worsened expressive abilities c/b moderate to severe word finding deficits. She will require access to a SGD. As such, she continues to require skilled ST intervention to instruct in  use, customization for use.    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  Jjesus Dingley B. Rutherford Nail M.S., CCC-SLP, [facility] Speech-Language Pathologist Rehabilitation Services Office [phone], [place] 12/07/2021, 4:11 PM  [place] MAIN [facility] SERVICES [address] [place], [place], [address] Phone: [phone]   Fax:  [phone]   Name: Carmen Gonzalez MRN: [id number] Date of Birth: 1951/05/02

## 2021-12-08 ENCOUNTER — Ambulatory Visit
Admission: RE | Admit: 2021-12-08 | Discharge: 2021-12-08 | Disposition: A | Discharge status: Home / Self Care | Payer: Medicare HMO | Source: Ambulatory Visit | Attending: Physician Assistant | Admitting: Physician Assistant

## 2021-12-08 ENCOUNTER — Ambulatory Visit: Payer: Medicare HMO | Admitting: Speech Pathology

## 2021-12-08 ENCOUNTER — Other Ambulatory Visit: Payer: Self-pay

## 2021-12-08 DIAGNOSIS — R4701 Aphasia: Secondary | ICD-10-CM | POA: Diagnosis not present

## 2021-12-08 DIAGNOSIS — Z1231 Encounter for screening mammogram for malignant neoplasm of breast: Secondary | ICD-10-CM | POA: Diagnosis present

## 2021-12-08 DIAGNOSIS — G3101 Pick's disease: Secondary | ICD-10-CM

## 2021-12-08 DIAGNOSIS — F0280 Dementia in other diseases classified elsewhere without behavioral disturbance: Secondary | ICD-10-CM

## 2021-12-09 ENCOUNTER — Encounter: Payer: Medicare HMO | Admitting: Speech Pathology

## 2021-12-09 NOTE — Patient Instructions (Signed)
Send pictures of friends

## 2021-12-09 NOTE — Therapy (Signed)
[place] MAIN [facility] SERVICES [address] [place], [place], [address] Phone: [phone]   Fax:  [phone]  Speech Language Pathology Treatment  Patient Details  Name: JOLEAN MADARIAGA MRN: [id number] Date of Birth: 23-Sep-1951 Referring Provider (SLP): Dr Jennings Books   Encounter Date: 12/08/2021  Speech Therapy Progress Note     Dates of Reporting Period: 10/31/2021 thru 12/08/2021     Objective: Patient has been seen for 10 speech therapy sessions this reporting period targeting aphasia and use of SGD. Patient is making progress toward LTGs and met all STGs this reporting period. Additional goals added to target use of SGD. See skilled intervention, clinical impressions, and goals below for details.    End of Session - 12/09/21 1113     Visit Number 10    Number of Visits 25    Date for SLP Re-Evaluation 01/24/22    Authorization Type Medicare    Authorization Time Period 10/31/2021 thru 03/0/2023    Authorization - Visit Number 10    Progress Note Due on Visit 10    SLP Start Time 1000    SLP Stop Time  1100    SLP Time Calculation (min) 60 min    Activity Tolerance Patient tolerated treatment well             Past Medical History:  Diagnosis Date   Allergy    Arthritis    Asthma    Cataract cortical, senile    COPD (chronic obstructive pulmonary disease) ([place])    Deviated septum    Diabetes mellitus without complication ([place])    Glaucoma    Hypercholesteremia    Hyperlipidemia    Hypertension    Obesity    Tuberculosis     Past Surgical History:  Procedure Laterality Date   COLONOSCOPY WITH PROPOFOL N/A 01/17/2016   Procedure: COLONOSCOPY WITH PROPOFOL;  Surgeon: Manya Silvas, MD;  Location: [place];  Service: Endoscopy;  Laterality: N/A;   COLONOSCOPY WITH PROPOFOL N/A 05/04/2021   Procedure: COLONOSCOPY WITH PROPOFOL;  Surgeon: Toledo, Benay Pike, MD;  Location: [facility];  Service:  Gastroenterology;  Laterality: N/A;   CT ARTHROGRAM KNEE RT WO ([facility] HX)     NASAL FRACTURE SURGERY      There were no vitals filed for this visit.   Subjective Assessment - 12/09/21 1104     Subjective pt pleasant, semantic paraphasias present in language today    Currently in Pain? No/denies                   ADULT SLP TREATMENT - 12/09/21 0001       Treatment Provided   Treatment provided Cognitive-Linquistic      Cognitive-Linquistic Treatment   Treatment focused on Aphasia;Patient/family/caregiver education    Skilled Treatment Skilled treatment session focused on pt's expressive language deficits. Pt's utterances to be 2 words in length and during today's session contained semantic paraphasias. Pt had delayed awareness of verbal errors. SLP further facilitated session by having pt select icons and communication on her trial SGD device. SLP also facilitated updating pt's medical conditions and medications list on her phone and SGD.              SLP Education - 12/09/21 1113     Education Details SGD    Person(s) Educated Patient    Methods Explanation;Demonstration    Comprehension Verbalized understanding;Need further instruction  SLP Short Term Goals - 12/09/21 1114       SLP SHORT TERM GOAL #1   Title The patient will answer simple biographical questions at 80% accuracy given frequent maximal verbal and maximal visual cues.    Baseline goal achieved, goal revised    Time 10    Period --   sessions   Status Revised      SLP SHORT TERM GOAL #2   Title The patient will add new vocabulary to speech generating device at 80% accuracy given frequent moderate verbal and moderate visual cues in order to communicate wants and needs.    Baseline goal achieved, goal revised    Time 10    Period --   sessions   Status Revised      SLP SHORT TERM GOAL #3   Title Pt and her husband will create list of 10 functional phrases (scripts) to  increase automaticity of daily communication.    Status Achieved              SLP Long Term Goals - 12/09/21 1118       SLP LONG TERM GOAL #1   Title To maximize functional communication across all communication contexts.    Status On-going    Target Date 01/24/22              Plan - 12/09/21 1114     Clinical Impression Statement Pt presents with progressively worsened expressive abilities c/b moderate to severe word finding deficits. She will require access to a SGD. As such, she continues to require skilled ST intervention to instruct in use, customization for use.    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  Kloey Cazarez B. Rutherford Nail M.S., CCC-SLP, [facility] Pathologist Rehabilitation Services Office [phone], [place] 12/09/2021, 11:19 AM  [place] MAIN [facility] SERVICES [address] [place], [place], [address] Phone: [phone]   Fax:  [phone]   Name: REBBIE LAURICELLA MRN: [id number] Date of Birth: 01-09-1951

## 2021-12-13 ENCOUNTER — Other Ambulatory Visit: Payer: Self-pay

## 2021-12-13 ENCOUNTER — Ambulatory Visit: Payer: Medicare HMO | Admitting: Speech Pathology

## 2021-12-13 DIAGNOSIS — R4701 Aphasia: Secondary | ICD-10-CM | POA: Diagnosis not present

## 2021-12-13 DIAGNOSIS — G3101 Pick's disease: Secondary | ICD-10-CM

## 2021-12-14 ENCOUNTER — Other Ambulatory Visit: Payer: Self-pay | Admitting: Physician Assistant

## 2021-12-14 DIAGNOSIS — R921 Mammographic calcification found on diagnostic imaging of breast: Secondary | ICD-10-CM

## 2021-12-14 DIAGNOSIS — R928 Other abnormal and inconclusive findings on diagnostic imaging of breast: Secondary | ICD-10-CM

## 2021-12-14 NOTE — Patient Instructions (Signed)
Create list of her husband's medical conditions

## 2021-12-14 NOTE — Therapy (Signed)
[place] MAIN [facility] SERVICES [address] [place], [place], [address] Phone: [phone]   Fax:  [phone]  Speech Language Pathology Treatment  Patient Details  Name: Carmen Gonzalez MRN: [id number] Date of Birth: Jan 31, 1951 Referring Provider (SLP): Dr Jennings Books   Encounter Date: 12/13/2021   End of Session - 12/14/21 2222     Visit Number 11    Number of Visits 25    Date for SLP Re-Evaluation 01/24/22    Authorization Type Medicare    Authorization Time Period 10/31/2021 thru 03/0/2023    Authorization - Visit Number 1    Progress Note Due on Visit 10    SLP Start Time 1000    SLP Stop Time  1100    SLP Time Calculation (min) 60 min    Activity Tolerance Patient tolerated treatment well             Past Medical History:  Diagnosis Date   Allergy    Arthritis    Asthma    Cataract cortical, senile    COPD (chronic obstructive pulmonary disease) ([place])    Deviated septum    Diabetes mellitus without complication ([place])    Glaucoma    Hypercholesteremia    Hyperlipidemia    Hypertension    Obesity    Tuberculosis     Past Surgical History:  Procedure Laterality Date   COLONOSCOPY WITH PROPOFOL N/A 01/17/2016   Procedure: COLONOSCOPY WITH PROPOFOL;  Surgeon: Manya Silvas, MD;  Location: [place];  Service: Endoscopy;  Laterality: N/A;   COLONOSCOPY WITH PROPOFOL N/A 05/04/2021   Procedure: COLONOSCOPY WITH PROPOFOL;  Surgeon: Toledo, Benay Pike, MD;  Location: [facility];  Service: Gastroenterology;  Laterality: N/A;   CT ARTHROGRAM KNEE RT WO ([facility] HX)     NASAL FRACTURE SURGERY      There were no vitals filed for this visit.   Subjective Assessment - 12/14/21 2221     Subjective pt eager to interact with SGD, pressing different icons    Currently in Pain? No/denies                   ADULT SLP TREATMENT - 12/14/21 0001       Treatment Provided   Treatment provided  Cognitive-Linquistic      Cognitive-Linquistic Treatment   Treatment focused on Aphasia;Patient/family/caregiver education    Skilled Treatment Skilled treatment session focused on assisting pt with SGD. Pt instrumental in creating additional icons/information that needed to be added to device. New icons created were: list of her husband's medicines, list of her husband's doctors, Carmen Gonzalez, [employer], Carmen Gonzalez, Carmen Gonzalez, Carmen Gonzalez, [place], Carmen Gonzalez as well as important information regarding advanced directives for her husband should she need to communicate those in her husband's behave (husband has terminal cancer dx).              SLP Education - 12/14/21 2222     Education Details SGD    Person(s) Educated Patient    Methods Explanation;Demonstration;Verbal cues;Handout    Comprehension Verbalized understanding;Need further instruction;Returned demonstration              SLP Short Term Goals - 12/09/21 1114       SLP SHORT TERM GOAL #1   Title The patient will answer simple biographical questions at 80% accuracy given frequent maximal verbal and maximal visual cues.    Baseline goal achieved, goal revised    Time 10  Period --   sessions   Status Revised      SLP SHORT TERM GOAL #2   Title The patient will add new vocabulary to speech generating device at 80% accuracy given frequent moderate verbal and moderate visual cues in order to communicate wants and needs.    Baseline goal achieved, goal revised    Time 10    Period --   sessions   Status Revised      SLP SHORT TERM GOAL #3   Title Pt and her husband will create list of 10 functional phrases (scripts) to increase automaticity of daily communication.    Status Achieved              SLP Long Term Goals - 12/09/21 1118       SLP LONG TERM GOAL #1   Title To maximize functional communication across all communication contexts.    Status On-going    Target Date 01/24/22              Plan -  12/14/21 2223     Clinical Impression Statement Pt presents with progressively worsened expressive abilities c/b moderate to severe word finding deficits. She will need to proficiently use a SGD to effectively communication her wants and needs as well as those of her husband should he become unable to d/t his terminal cancer dx.  As such, she continues to require skilled ST intervention to instruct in use, customization for SGD use.    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  Carmen Gonzalez B. Rutherford Nail M.S., CCC-SLP, [facility] Pathologist Rehabilitation Services Office [place], [place] 12/14/2021, 10:25 PM  [place] MAIN [facility] SERVICES [address] [place], [place], [address] Phone: [phone]   Fax:  [phone]   Name: Carmen Gonzalez MRN: [id number] Date of Birth: 25-Jan-1951

## 2021-12-15 ENCOUNTER — Ambulatory Visit: Payer: Medicare HMO | Admitting: Speech Pathology

## 2021-12-15 ENCOUNTER — Other Ambulatory Visit: Payer: Self-pay

## 2021-12-15 DIAGNOSIS — G3101 Pick's disease: Secondary | ICD-10-CM

## 2021-12-15 DIAGNOSIS — F0280 Dementia in other diseases classified elsewhere without behavioral disturbance: Secondary | ICD-10-CM

## 2021-12-15 DIAGNOSIS — R4701 Aphasia: Secondary | ICD-10-CM

## 2021-12-16 ENCOUNTER — Encounter: Payer: Medicare HMO | Admitting: Speech Pathology

## 2021-12-16 NOTE — Patient Instructions (Signed)
Use SGD at home, investigate icon [employer], take photos of interesting sections

## 2021-12-16 NOTE — Therapy (Signed)
[place] MAIN [facility] SERVICES [address] [place], [place], [address] Phone: [phone]   Fax:  [phone]  Speech Language Pathology Treatment  Patient Details  Name: Carmen Gonzalez MRN: [id number] Date of Birth: [birth year] Referring Provider (SLP): Dr Jennings Books   Encounter Date: 12/15/2021   End of Session - 12/16/21 1103     Visit Number 12    Number of Visits 25    Date for SLP Re-Evaluation 01/24/22    Authorization Type Medicare    Authorization Time Period 10/31/2021 thru 03/0/2023    Authorization - Visit Number 2    Progress Note Due on Visit 10    SLP Start Time 1100    SLP Stop Time  1200    SLP Time Calculation (min) 60 min    Activity Tolerance Patient tolerated treatment well             Past Medical History:  Diagnosis Date   Allergy    Arthritis    Asthma    Cataract cortical, senile    COPD (chronic obstructive pulmonary disease) ([place])    Deviated septum    Diabetes mellitus without complication ([place])    Glaucoma    Hypercholesteremia    Hyperlipidemia    Hypertension    Obesity    Tuberculosis     Past Surgical History:  Procedure Laterality Date   COLONOSCOPY WITH PROPOFOL N/A 01/17/2016   Procedure: COLONOSCOPY WITH PROPOFOL;  Surgeon: Manya Silvas, MD;  Location: [place];  Service: Endoscopy;  Laterality: N/A;   COLONOSCOPY WITH PROPOFOL N/A 05/04/2021   Procedure: COLONOSCOPY WITH PROPOFOL;  Surgeon: Toledo, Benay Pike, MD;  Location: [facility];  Service: Gastroenterology;  Laterality: N/A;   CT ARTHROGRAM KNEE RT WO ([facility] HX)     NASAL FRACTURE SURGERY      There were no vitals filed for this visit.   Subjective Assessment - 12/16/21 1101     Subjective pt eager to use SGD but concerned that she might "mess it up"    Currently in Pain? No/denies                   ADULT SLP TREATMENT - 12/16/21 0001       Treatment Provided   Treatment provided  Cognitive-Linquistic      Cognitive-Linquistic Treatment   Treatment focused on Aphasia;Patient/family/caregiver education    Skilled Treatment Skilled treatment session focused on assisting pt with SGD. SLP provided in instruction on TouchTalk device - button bar, battery and reassurance that pt will not permanently alter choices when investigating all icons on device. Pt made aware of icon [place] and instructed to take pictures of icons that she would like to add to home screen. In having pt explore icons, pt's medication list, list of medical conditions and allergies need to be edited as the photo of the printed list was too small to be read on icon.              SLP Education - 12/16/21 1103     Education Details button bar, icon Surveyor, quantity) Educated Patient    Methods Explanation;Demonstration;Verbal cues;Handout    Comprehension Verbalized understanding;Need further instruction              SLP Short Term Goals - 12/09/21 1114       SLP SHORT TERM GOAL #1   Title The patient will answer simple biographical questions at 80% accuracy given frequent  maximal verbal and maximal visual cues.    Baseline goal achieved, goal revised    Time 10    Period --   sessions   Status Revised      SLP SHORT TERM GOAL #2   Title The patient will add new vocabulary to speech generating device at 80% accuracy given frequent moderate verbal and moderate visual cues in order to communicate wants and needs.    Baseline goal achieved, goal revised    Time 10    Period --   sessions   Status Revised      SLP SHORT TERM GOAL #3   Title Pt and her husband will create list of 10 functional phrases (scripts) to increase automaticity of daily communication.    Status Achieved              SLP Long Term Goals - 12/09/21 1118       SLP LONG TERM GOAL #1   Title To maximize functional communication across all communication contexts.    Status On-going    Target Date 01/24/22               Plan - 12/16/21 1104     Clinical Impression Statement Pt presents with progressively worsened expressive abilities now c/b severe word finding deficits. She will need to proficiently use a SGD to effectively communication her wants and needs as well as those of her husband should he become unable to d/t his terminal cancer dx.  As such, she continues to require skilled ST intervention to instruct in use, customization for SGD use.    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   Stormy Fabian, [place] 12/16/2021, 11:07 AM  [place] MAIN [facility] SERVICES [address] [place], [place], [address] Phone: [phone]   Fax:  [phone]   Name: CHELCEE KORPI MRN: [id number] Date of Birth: 14-Jul-1951

## 2021-12-20 ENCOUNTER — Other Ambulatory Visit: Payer: Self-pay

## 2021-12-20 ENCOUNTER — Ambulatory Visit: Payer: Medicare HMO | Admitting: Speech Pathology

## 2021-12-20 DIAGNOSIS — R4701 Aphasia: Secondary | ICD-10-CM | POA: Diagnosis not present

## 2021-12-20 DIAGNOSIS — F0280 Dementia in other diseases classified elsewhere without behavioral disturbance: Secondary | ICD-10-CM

## 2021-12-20 NOTE — Patient Instructions (Signed)
Use search button to select icons

## 2021-12-20 NOTE — Therapy (Signed)
[place] MAIN [facility] SERVICES [address] [place], [place], [address] Phone: [phone]   Fax:  [phone]  Speech Language Pathology Treatment  Patient Details  Name: Carmen Gonzalez MRN: [id number] Date of Birth: 05/29/1951 Referring Provider (SLP): Dr Jennings Books   Encounter Date: 12/20/2021   End of Session - 12/20/21 1900     Visit Number 13    Number of Visits 25    Date for SLP Re-Evaluation 01/24/22    Authorization Type Medicare    Authorization Time Period 10/31/2021 thru 01/24/2022    Authorization - Visit Number 3    Progress Note Due on Visit 10    SLP Start Time 0900    SLP Stop Time  1000    SLP Time Calculation (min) 60 min    Activity Tolerance Patient tolerated treatment well             Past Medical History:  Diagnosis Date   Allergy    Arthritis    Asthma    Cataract cortical, senile    COPD (chronic obstructive pulmonary disease) ([place])    Deviated septum    Diabetes mellitus without complication ([place])    Glaucoma    Hypercholesteremia    Hyperlipidemia    Hypertension    Obesity    Tuberculosis     Past Surgical History:  Procedure Laterality Date   COLONOSCOPY WITH PROPOFOL N/A 01/17/2016   Procedure: COLONOSCOPY WITH PROPOFOL;  Surgeon: Manya Silvas, MD;  Location: [place];  Service: Endoscopy;  Laterality: N/A;   COLONOSCOPY WITH PROPOFOL N/A 05/04/2021   Procedure: COLONOSCOPY WITH PROPOFOL;  Surgeon: Toledo, Benay Pike, MD;  Location: [facility];  Service: Gastroenterology;  Laterality: N/A;   CT ARTHROGRAM KNEE RT WO ([facility] HX)     NASAL FRACTURE SURGERY      There were no vitals filed for this visit.   Subjective Assessment - 12/20/21 1856     Subjective "I have something I want to add" to the SGD    Currently in Pain? No/denies                   ADULT SLP TREATMENT - 12/20/21 0001       Treatment Provided   Treatment provided Cognitive-Linquistic       Cognitive-Linquistic Treatment   Treatment focused on Aphasia;Patient/family/caregiver education    Skilled Treatment Skilled treatment session focused on assisting pt with SGD.     Pt had taken pictures of icons - words, feelings, I need an appoint, clothes, senses, symptoms, taste, placement.  These icons were added as well as Dentist; made doctor into folder with PCP, Neurologist and eye doctor with phone numbers; create folder of medical conditions with each condition listed as well as list of allergies for ease of use.   Written instructions with pictures taken of each step, were provided on ways to use the search button and selecting folders instead of single touch icons.              SLP Education - 12/20/21 1900     Education Details search function    Person(s) Educated Patient    Methods Explanation;Demonstration;Verbal cues;Handout    Comprehension Verbalized understanding;Need further instruction              SLP Short Term Goals - 12/09/21 1114       SLP SHORT TERM GOAL #1   Title The patient will answer simple biographical questions at  80% accuracy given frequent maximal verbal and maximal visual cues.    Baseline goal achieved, goal revised    Time 10    Period --   sessions   Status Revised      SLP SHORT TERM GOAL #2   Title The patient will add new vocabulary to speech generating device at 80% accuracy given frequent moderate verbal and moderate visual cues in order to communicate wants and needs.    Baseline goal achieved, goal revised    Time 10    Period --   sessions   Status Revised      SLP SHORT TERM GOAL #3   Title Pt and her husband will create list of 10 functional phrases (scripts) to increase automaticity of daily communication.    Status Achieved              SLP Long Term Goals - 12/09/21 1118       SLP LONG TERM GOAL #1   Title To maximize functional communication across all communication contexts.    Status On-going     Target Date 01/24/22              Plan - 12/20/21 1901     Clinical Impression Statement Pt presents with progressively worsened expressive abilities now c/b severe word finding deficits. She will need to proficiently use a SGD to effectively communication her wants and needs as well as those of her husband should he become unable to d/t his terminal cancer dx.  She reports that her daughter came over and they "played with it and found a lot of other things." As such, she continues to require skilled ST intervention to instruct in use, customization for SGD use.    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  Cameran Ahmed B. Rutherford Nail M.S., CCC-SLP, [facility] Pathologist Rehabilitation Services Office [phone], [place] 12/20/2021, 7:02 PM  [place] MAIN [facility] SERVICES [address] [place], [place], [address] Phone: [phone]   Fax:  [phone]   Name: SHIRLYN SAVIN MRN: [id number] Date of Birth: September 20, 1951

## 2021-12-22 ENCOUNTER — Other Ambulatory Visit: Payer: Self-pay

## 2021-12-22 ENCOUNTER — Ambulatory Visit: Payer: Medicare HMO | Attending: Neurology | Admitting: Speech Pathology

## 2021-12-22 DIAGNOSIS — F0280 Dementia in other diseases classified elsewhere without behavioral disturbance: Secondary | ICD-10-CM | POA: Diagnosis present

## 2021-12-22 DIAGNOSIS — R4701 Aphasia: Secondary | ICD-10-CM | POA: Insufficient documentation

## 2021-12-22 DIAGNOSIS — G3101 Pick's disease: Secondary | ICD-10-CM | POA: Diagnosis present

## 2021-12-22 NOTE — Patient Instructions (Signed)
Using SEARCH add list of provided icons to specified locations Using delete, delete specified icons from stated locations

## 2021-12-22 NOTE — Therapy (Signed)
[place] MAIN [facility] SERVICES [address] [place], [place], [address] Phone: [phone]   Fax:  [phone]  Speech Language Pathology Treatment  Patient Details  Name: NOVICE VRBA MRN: [id number] Date of Birth: Jun 01, 1951 Referring Provider (SLP): Dr Jennings Books   Encounter Date: 12/22/2021   End of Session - 12/22/21 1657     Visit Number 14    Number of Visits 25    Date for SLP Re-Evaluation 01/24/22    Authorization Type Medicare    Authorization Time Period 10/31/2021 thru 01/24/2022    Authorization - Visit Number 4    Progress Note Due on Visit 10    SLP Start Time 1100    SLP Stop Time  1145    SLP Time Calculation (min) 45 min    Activity Tolerance Patient tolerated treatment well             Past Medical History:  Diagnosis Date   Allergy    Arthritis    Asthma    Cataract cortical, senile    COPD (chronic obstructive pulmonary disease) ([place])    Deviated septum    Diabetes mellitus without complication ([place])    Glaucoma    Hypercholesteremia    Hyperlipidemia    Hypertension    Obesity    Tuberculosis     Past Surgical History:  Procedure Laterality Date   COLONOSCOPY WITH PROPOFOL N/A 01/17/2016   Procedure: COLONOSCOPY WITH PROPOFOL;  Surgeon: Manya Silvas, MD;  Location: [place];  Service: Endoscopy;  Laterality: N/A;   COLONOSCOPY WITH PROPOFOL N/A 05/04/2021   Procedure: COLONOSCOPY WITH PROPOFOL;  Surgeon: Toledo, Benay Pike, MD;  Location: [facility];  Service: Gastroenterology;  Laterality: N/A;   CT ARTHROGRAM KNEE RT WO ([facility] HX)     NASAL FRACTURE SURGERY      There were no vitals filed for this visit.   Subjective Assessment - 12/22/21 1655     Subjective "how do I do this?" referring to adding icons    Currently in Pain? No/denies                   ADULT SLP TREATMENT - 12/22/21 0001       Treatment Provided   Treatment provided Cognitive-Linquistic       Cognitive-Linquistic Treatment   Treatment focused on Aphasia;Patient/family/caregiver education    Skilled Treatment Skilled instruction provided in using SEARCH to add icons to home and folds. Written support provided. Pt able to use with intermittent assistance to add 3 icons. Instruction with written support provided in deleting icons. With minimal assistance, pt able to demonstrate in 2 of 3 opportunities.              SLP Education - 12/22/21 1657     Education Details search and delete function    Person(s) Educated Patient    Methods Explanation;Demonstration;Verbal cues;Handout    Comprehension Verbalized understanding;Need further instruction              SLP Short Term Goals - 12/09/21 1114       SLP SHORT TERM GOAL #1   Title The patient will answer simple biographical questions at 80% accuracy given frequent maximal verbal and maximal visual cues.    Baseline goal achieved, goal revised    Time 10    Period --   sessions   Status Revised      SLP SHORT TERM GOAL #2   Title The patient will  add new vocabulary to speech generating device at 80% accuracy given frequent moderate verbal and moderate visual cues in order to communicate wants and needs.    Baseline goal achieved, goal revised    Time 10    Period --   sessions   Status Revised      SLP SHORT TERM GOAL #3   Title Pt and her husband will create list of 10 functional phrases (scripts) to increase automaticity of daily communication.    Status Achieved              SLP Long Term Goals - 12/09/21 1118       SLP LONG TERM GOAL #1   Title To maximize functional communication across all communication contexts.    Status On-going    Target Date 01/24/22              Plan - 12/22/21 1658     Clinical Impression Statement Pt continues to be very excited about SGD and eager to learn all functions to increase her independence with communcating. Pt is responding well to instruction  regarding device features but continues to require skilled ST intervention to further instruct in SGD use.    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  Rabecca Birge B. Rutherford Nail M.S., CCC-SLP, [facility] Pathologist Rehabilitation Services Office [place], [place] 12/22/2021, 5:00 PM  [place] MAIN [facility] SERVICES [address] [place], [place], [address] Phone: [phone]   Fax:  [phone]   Name: KAIYANA BEDORE MRN: [id number] Date of Birth: 11/27/1950

## 2021-12-23 ENCOUNTER — Encounter: Payer: Medicare HMO | Admitting: Speech Pathology

## 2021-12-23 ENCOUNTER — Ambulatory Visit
Admission: RE | Admit: 2021-12-23 | Discharge: 2021-12-23 | Disposition: A | Discharge status: Home / Self Care | Payer: Medicare HMO | Source: Ambulatory Visit | Attending: Physician Assistant | Admitting: Physician Assistant

## 2021-12-23 DIAGNOSIS — R928 Other abnormal and inconclusive findings on diagnostic imaging of breast: Secondary | ICD-10-CM | POA: Insufficient documentation

## 2021-12-23 DIAGNOSIS — R921 Mammographic calcification found on diagnostic imaging of breast: Secondary | ICD-10-CM | POA: Diagnosis present

## 2021-12-27 ENCOUNTER — Other Ambulatory Visit: Payer: Self-pay

## 2021-12-27 ENCOUNTER — Ambulatory Visit: Payer: Medicare HMO | Admitting: Speech Pathology

## 2021-12-27 DIAGNOSIS — G3101 Pick's disease: Secondary | ICD-10-CM

## 2021-12-27 DIAGNOSIS — R4701 Aphasia: Secondary | ICD-10-CM

## 2021-12-27 DIAGNOSIS — F0280 Dementia in other diseases classified elsewhere without behavioral disturbance: Secondary | ICD-10-CM

## 2021-12-28 NOTE — Patient Instructions (Signed)
Add icons using icon Art therapist and images from [employer]

## 2021-12-28 NOTE — Therapy (Signed)
[place] MAIN [facility] SERVICES [address] [place], [place], [address] Phone: [phone]   Fax:  [phone]  Speech Language Pathology Treatment  Patient Details  Name: Carmen Gonzalez MRN: [id number] Date of Birth: 21-May-1951 Referring Provider (SLP): Dr Jennings Books   Encounter Date: 12/27/2021   End of Session - 12/28/21 1205     Visit Number 15    Number of Visits 25    Date for SLP Re-Evaluation 01/24/22    Authorization Type Medicare    Authorization Time Period 10/31/2021 thru 01/24/2022    Authorization - Visit Number 5    Progress Note Due on Visit 10    SLP Start Time 1100    SLP Stop Time  1200    SLP Time Calculation (min) 60 min    Activity Tolerance Patient tolerated treatment well             Past Medical History:  Diagnosis Date   Allergy    Arthritis    Asthma    Cataract cortical, senile    COPD (chronic obstructive pulmonary disease) ([place])    Deviated septum    Diabetes mellitus without complication ([place])    Glaucoma    Hypercholesteremia    Hyperlipidemia    Hypertension    Obesity    Tuberculosis     Past Surgical History:  Procedure Laterality Date   COLONOSCOPY WITH PROPOFOL N/A 01/17/2016   Procedure: COLONOSCOPY WITH PROPOFOL;  Surgeon: Manya Silvas, MD;  Location: [place];  Service: Endoscopy;  Laterality: N/A;   COLONOSCOPY WITH PROPOFOL N/A 05/04/2021   Procedure: COLONOSCOPY WITH PROPOFOL;  Surgeon: Toledo, Benay Pike, MD;  Location: [facility];  Service: Gastroenterology;  Laterality: N/A;   CT ARTHROGRAM KNEE RT WO ([facility] HX)     NASAL FRACTURE SURGERY      There were no vitals filed for this visit.   Subjective Assessment - 12/28/21 1155     Subjective "Can you help me with this?" referring to email from [place]    Currently in Pain? No/denies                   ADULT SLP TREATMENT - 12/28/21 0001       Treatment Provided   Treatment provided  Cognitive-Linquistic      Cognitive-Linquistic Treatment   Treatment focused on Aphasia;Patient/family/caregiver education    Skilled Treatment Assisted pt in responding to email d/t inability to read email and follow it's directions. SLP further facilitated session by providing written instructions - adding icon using image from web to add "[employer]. "Pt able to add husband's medical  conditions with moderate assistance - high blood pressure, diabetes, retinal vein occlusion, glaucoma, metastatic prostate cancer - pharmacy info added - [place] [place]              SLP Education - 12/28/21 1205     Education Details adding icons using images from [employer]) Educated Patient    Methods Explanation;Demonstration;Verbal cues;Handout    Comprehension Verbalized understanding;Need further instruction              SLP Short Term Goals - 12/09/21 1114       SLP SHORT TERM GOAL #1   Title The patient will answer simple biographical questions at 80% accuracy given frequent maximal verbal and maximal visual cues.    Baseline goal achieved, goal revised    Time 10    Period --  sessions   Status Revised      SLP SHORT TERM GOAL #2   Title The patient will add new vocabulary to speech generating device at 80% accuracy given frequent moderate verbal and moderate visual cues in order to communicate wants and needs.    Baseline goal achieved, goal revised    Time 10    Period --   sessions   Status Revised      SLP SHORT TERM GOAL #3   Title Pt and her husband will create list of 10 functional phrases (scripts) to increase automaticity of daily communication.    Status Achieved              SLP Long Term Goals - 12/09/21 1118       SLP LONG TERM GOAL #1   Title To maximize functional communication across all communication contexts.    Status On-going    Target Date 01/24/22              Plan - 12/28/21 1205     Clinical Impression Statement Pt  continues to be very excited about SGD and eager to learn all functions to increase her independence with communcating. Pt is responding well to instruction regarding device features but continues to require skilled ST intervention to further instruct in SGD use.    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  Ronan Dion B. Rutherford Nail M.S., CCC-SLP, [facility] Pathologist Rehabilitation Services Office [phone]  Stormy Fabian, [place] 12/28/2021, 12:06 PM  [place] MAIN [facility] SERVICES [address] [place], [place], [address] Phone: [phone]   Fax:  [phone]   Name: Carmen Gonzalez MRN: [id number] Date of Birth: 08-25-1951

## 2021-12-29 ENCOUNTER — Other Ambulatory Visit: Payer: Self-pay

## 2021-12-29 ENCOUNTER — Ambulatory Visit: Payer: Medicare HMO | Admitting: Speech Pathology

## 2021-12-29 DIAGNOSIS — R4701 Aphasia: Secondary | ICD-10-CM

## 2021-12-29 DIAGNOSIS — F0280 Dementia in other diseases classified elsewhere without behavioral disturbance: Secondary | ICD-10-CM

## 2021-12-29 DIAGNOSIS — G3101 Pick's disease: Secondary | ICD-10-CM

## 2021-12-30 ENCOUNTER — Encounter: Payer: Medicare HMO | Admitting: Speech Pathology

## 2021-12-30 NOTE — Therapy (Signed)
[place] MAIN [facility] SERVICES [address] [place], [place], [address] Phone: [phone]   Fax:  [phone]  Speech Language Pathology Treatment  Patient Details  Name: ABIGALE DOROW MRN: [id number] Date of Birth: [birth year] Referring Provider (SLP): Dr Jennings Books   Encounter Date: 12/29/2021   End of Session - 12/30/21 0926     Visit Number 16    Number of Visits 25    Date for SLP Re-Evaluation 01/24/22    Authorization Type Medicare    Authorization Time Period 10/31/2021 thru 01/24/2022    Authorization - Visit Number 6    Progress Note Due on Visit 10    SLP Start Time 1100    SLP Stop Time  1200    SLP Time Calculation (min) 60 min    Activity Tolerance Patient tolerated treatment well             Past Medical History:  Diagnosis Date   Allergy    Arthritis    Asthma    Cataract cortical, senile    COPD (chronic obstructive pulmonary disease) ([place])    Deviated septum    Diabetes mellitus without complication ([place])    Glaucoma    Hypercholesteremia    Hyperlipidemia    Hypertension    Obesity    Tuberculosis     Past Surgical History:  Procedure Laterality Date   COLONOSCOPY WITH PROPOFOL N/A 01/17/2016   Procedure: COLONOSCOPY WITH PROPOFOL;  Surgeon: Manya Silvas, MD;  Location: [place];  Service: Endoscopy;  Laterality: N/A;   COLONOSCOPY WITH PROPOFOL N/A 05/04/2021   Procedure: COLONOSCOPY WITH PROPOFOL;  Surgeon: Toledo, Benay Pike, MD;  Location: [facility];  Service: Gastroenterology;  Laterality: N/A;   CT ARTHROGRAM KNEE RT WO ([facility] HX)     NASAL FRACTURE SURGERY      There were no vitals filed for this visit.   Subjective Assessment - 12/30/21 0913     Subjective "I got my new one"    Currently in Pain? No/denies                   ADULT SLP TREATMENT - 12/30/21 0001       Treatment Provided   Treatment provided Cognitive-Linquistic      Cognitive-Linquistic  Treatment   Treatment focused on Aphasia;Patient/family/caregiver education    Skilled Treatment Helped pt set up her new device, as well as read letter from [place] about [place] therapy, contacted [employer] to have latest set-up downloaded to her device. Pt also assisted in further customization of conversational information about her husband's hobby of fishing. With minimal assistance, pt able to delete, copy and add appropriate language around fishing - saltwater fishing, freshwater fishing and a joke about her husband always catching catfish instead of bass.              SLP Education - 12/30/21 0925     Education Details device    Person(s) Educated Patient    Methods Explanation;Demonstration;Verbal cues;Handout    Comprehension Verbalized understanding;Need further instruction              SLP Short Term Goals - 12/09/21 1114       SLP SHORT TERM GOAL #1   Title The patient will answer simple biographical questions at 80% accuracy given frequent maximal verbal and maximal visual cues.    Baseline goal achieved, goal revised    Time 10    Period --   sessions  Status Revised      SLP SHORT TERM GOAL #2   Title The patient will add new vocabulary to speech generating device at 80% accuracy given frequent moderate verbal and moderate visual cues in order to communicate wants and needs.    Baseline goal achieved, goal revised    Time 10    Period --   sessions   Status Revised      SLP SHORT TERM GOAL #3   Title Pt and her husband will create list of 10 functional phrases (scripts) to increase automaticity of daily communication.    Status Achieved              SLP Long Term Goals - 12/09/21 1118       SLP LONG TERM GOAL #1   Title To maximize functional communication across all communication contexts.    Status On-going    Target Date 01/24/22              Plan - 12/30/21 2836     Clinical Impression Statement Pt continues to be very  excited about SGD and eager to learn all functions to increase her independence with communcating. Pt is responding well to instruction regarding device features but continues to require skilled ST intervention to further instruct in SGD use.    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  Ova Meegan B. Rutherford Nail M.S., CCC-SLP, [facility] Pathologist Rehabilitation Services Office [phone], [place] 12/30/2021, 9:27 AM  [place] MAIN [facility] SERVICES [address] [place], [place], [address] Phone: [phone]   Fax:  [phone]   Name: ASHLI SELDERS MRN: [id number] Date of Birth: Oct 20, 1951

## 2021-12-30 NOTE — Patient Instructions (Signed)
Add conversational icons about her daughters

## 2022-01-03 ENCOUNTER — Other Ambulatory Visit: Payer: Self-pay

## 2022-01-03 ENCOUNTER — Ambulatory Visit: Payer: Medicare HMO | Admitting: Speech Pathology

## 2022-01-03 DIAGNOSIS — R4701 Aphasia: Secondary | ICD-10-CM | POA: Diagnosis not present

## 2022-01-03 DIAGNOSIS — G3101 Pick's disease: Secondary | ICD-10-CM

## 2022-01-03 DIAGNOSIS — F0280 Dementia in other diseases classified elsewhere without behavioral disturbance: Secondary | ICD-10-CM

## 2022-01-04 NOTE — Therapy (Signed)
[place] MAIN [facility] SERVICES [address] [place], [place], [address] Phone: [phone]   Fax:  [phone]  Speech Language Pathology Treatment  Patient Details  Name: Carmen Gonzalez MRN: [id number] Date of Birth: 1951-03-14 Referring Provider (SLP): Dr Jennings Books   Encounter Date: 01/03/2022   End of Session - 01/04/22 1210     Visit Number 17    Number of Visits 25    Date for SLP Re-Evaluation 01/24/22    Authorization Type Medicare    Authorization Time Period 10/31/2021 thru 01/24/2022    Authorization - Visit Number 7    Progress Note Due on Visit 10    SLP Start Time 1100    SLP Stop Time  1200    SLP Time Calculation (min) 60 min    Activity Tolerance Patient tolerated treatment well             Past Medical History:  Diagnosis Date   Allergy    Arthritis    Asthma    Cataract cortical, senile    COPD (chronic obstructive pulmonary disease) ([place])    Deviated septum    Diabetes mellitus without complication ([place])    Glaucoma    Hypercholesteremia    Hyperlipidemia    Hypertension    Obesity    Tuberculosis     Past Surgical History:  Procedure Laterality Date   COLONOSCOPY WITH PROPOFOL N/A 01/17/2016   Procedure: COLONOSCOPY WITH PROPOFOL;  Surgeon: Manya Silvas, MD;  Location: [place];  Service: Endoscopy;  Laterality: N/A;   COLONOSCOPY WITH PROPOFOL N/A 05/04/2021   Procedure: COLONOSCOPY WITH PROPOFOL;  Surgeon: Toledo, Benay Pike, MD;  Location: [facility];  Service: Gastroenterology;  Laterality: N/A;   CT ARTHROGRAM KNEE RT WO ([facility] HX)     NASAL FRACTURE SURGERY      There were no vitals filed for this visit.   Subjective Assessment - 01/04/22 1207     Subjective pt eager to use her iPad for the TalkPath Therapy app    Currently in Pain? No/denies                   ADULT SLP TREATMENT - 01/04/22 0001       Treatment Provided   Treatment provided  Cognitive-Linquistic      Cognitive-Linquistic Treatment   Treatment focused on Aphasia;Patient/family/caregiver education    Skilled Treatment Assisted pt in downloading [place], logging in and naviating the various activities.              SLP Education - 01/04/22 1210     Education Details TalkPath Therapy app    Person(s) Educated Patient    Methods Explanation;Demonstration;Verbal cues;Handout    Comprehension Verbalized understanding;Need further instruction              SLP Short Term Goals - 12/09/21 1114       SLP SHORT TERM GOAL #1   Title The patient will answer simple biographical questions at 80% accuracy given frequent maximal verbal and maximal visual cues.    Baseline goal achieved, goal revised    Time 10    Period --   sessions   Status Revised      SLP SHORT TERM GOAL #2   Title The patient will add new vocabulary to speech generating device at 80% accuracy given frequent moderate verbal and moderate visual cues in order to communicate wants and needs.    Baseline goal achieved, goal  revised    Time 10    Period --   sessions   Status Revised      SLP SHORT TERM GOAL #3   Title Pt and her husband will create list of 10 functional phrases (scripts) to increase automaticity of daily communication.    Status Achieved              SLP Long Term Goals - 12/09/21 1118       SLP LONG TERM GOAL #1   Title To maximize functional communication across all communication contexts.    Status On-going    Target Date 01/24/22              Plan - 01/04/22 1210     Clinical Impression Statement Pt continues to be very excited about her SGD as well as the TalkPath Therapy app. Pt is responding well to instruction regarding device features and now features of TalkPath Therapy. Skilled ST intervention is required to further in instruct in SGD and [place] app to promote functional language use.    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  Square Jowett B. Rutherford Nail M.S., CCC-SLP, [facility] Pathologist Rehabilitation Services Office [phone]  Stormy Fabian, [place] 01/04/2022, 12:14 PM  [place] MAIN [facility] SERVICES [address] [place], [place], [address] Phone: [phone]   Fax:  [phone]   Name: Carmen Gonzalez MRN: [id number] Date of Birth: 10-Oct-1951

## 2022-01-04 NOTE — Patient Instructions (Signed)
Explore the TalkPath Therapy appt Do the assigned activities

## 2022-01-05 ENCOUNTER — Encounter: Payer: Medicare HMO | Admitting: Speech Pathology

## 2022-01-06 ENCOUNTER — Encounter: Payer: Medicare HMO | Admitting: Speech Pathology

## 2022-01-10 ENCOUNTER — Other Ambulatory Visit: Payer: Self-pay

## 2022-01-10 ENCOUNTER — Ambulatory Visit: Payer: Medicare HMO | Admitting: Speech Pathology

## 2022-01-10 DIAGNOSIS — R4701 Aphasia: Secondary | ICD-10-CM | POA: Diagnosis not present

## 2022-01-10 DIAGNOSIS — F0280 Dementia in other diseases classified elsewhere without behavioral disturbance: Secondary | ICD-10-CM

## 2022-01-12 ENCOUNTER — Other Ambulatory Visit: Payer: Self-pay

## 2022-01-12 ENCOUNTER — Ambulatory Visit: Payer: Medicare HMO | Admitting: Speech Pathology

## 2022-01-12 DIAGNOSIS — R4701 Aphasia: Secondary | ICD-10-CM | POA: Diagnosis not present

## 2022-01-12 DIAGNOSIS — F0280 Dementia in other diseases classified elsewhere without behavioral disturbance: Secondary | ICD-10-CM

## 2022-01-12 NOTE — Therapy (Signed)
[place] MAIN [facility] SERVICES [address] [place], [place], [address] Phone: [phone]   Fax:  [phone]  Speech Language Pathology Treatment  Patient Details  Name: Carmen Gonzalez MRN: [id number] Date of Birth: 1950/12/05 Referring Provider (SLP): Dr Jennings Books   Encounter Date: 01/10/2022   End of Session - 01/12/22 0659     Visit Number 18    Number of Visits 25    Date for SLP Re-Evaluation 01/24/22    Authorization Type Medicare    Authorization Time Period 10/31/2021 thru 01/24/2022    Authorization - Visit Number 8    Progress Note Due on Visit 10    SLP Start Time 1100    SLP Stop Time  1200    SLP Time Calculation (min) 60 min    Activity Tolerance Patient tolerated treatment well             Past Medical History:  Diagnosis Date   Allergy    Arthritis    Asthma    Cataract cortical, senile    COPD (chronic obstructive pulmonary disease) ([place])    Deviated septum    Diabetes mellitus without complication ([place])    Glaucoma    Hypercholesteremia    Hyperlipidemia    Hypertension    Obesity    Tuberculosis     Past Surgical History:  Procedure Laterality Date   COLONOSCOPY WITH PROPOFOL N/A 01/17/2016   Procedure: COLONOSCOPY WITH PROPOFOL;  Surgeon: Manya Silvas, MD;  Location: [place];  Service: Endoscopy;  Laterality: N/A;   COLONOSCOPY WITH PROPOFOL N/A 05/04/2021   Procedure: COLONOSCOPY WITH PROPOFOL;  Surgeon: Toledo, Benay Pike, MD;  Location: [facility];  Service: Gastroenterology;  Laterality: N/A;   CT ARTHROGRAM KNEE RT WO ([facility] HX)     NASAL FRACTURE SURGERY      There were no vitals filed for this visit.   Subjective Assessment - 01/12/22 0657     Subjective "I can't get on" referring to logging into [place] app using mini iPad.    Currently in Pain? No/denies                   ADULT SLP TREATMENT - 01/12/22 0001       Treatment Provided   Treatment  provided Cognitive-Linquistic      Cognitive-Linquistic Treatment   Treatment focused on Aphasia;Patient/family/caregiver education    Skilled Treatment Skilled treatment session focused on revealing login information as pt stated password incorrectly substituting semantic paraphasias. Instruction provided on activities within app. SLP assisted pt in adding the following activities: WRITING - copying, spelling, sentence scramble, complete the phrase; FUNCTIONAL REPETITION - word repetition, phrase repetition; MEMORY - REMOTE MEMORY general knowledge, IMMEDIATE MEMORY - unfamiliar items, related word recall, letter recall, picture recall, number recall, unrelated word recall; [place] - inference, sings and symbols, what am I?, what's wrong?, ADL - FINDING INFO - all about dogs, cookie index, food labels, prescription labels              SLP Education - 01/12/22 0659     Education Details TalkPath Therapy App    Person(s) Educated Patient    Methods Explanation;Demonstration;Verbal cues;Handout    Comprehension Verbalized understanding;Need further instruction              SLP Short Term Goals - 12/09/21 1114       SLP SHORT TERM GOAL #1   Title The patient will answer simple  biographical questions at 80% accuracy given frequent maximal verbal and maximal visual cues.    Baseline goal achieved, goal revised    Time 10    Period --   sessions   Status Revised      SLP SHORT TERM GOAL #2   Title The patient will add new vocabulary to speech generating device at 80% accuracy given frequent moderate verbal and moderate visual cues in order to communicate wants and needs.    Baseline goal achieved, goal revised    Time 10    Period --   sessions   Status Revised      SLP SHORT TERM GOAL #3   Title Pt and her husband will create list of 10 functional phrases (scripts) to increase automaticity of daily communication.    Status Achieved              SLP  Long Term Goals - 12/09/21 1118       SLP LONG TERM GOAL #1   Title To maximize functional communication across all communication contexts.    Status On-going    Target Date 01/24/22              Plan - 01/12/22 4268     Clinical Impression Statement Pt continues with progressive decline in express language abilities as such she is relying on her SGD for communication. Pt requires continued instruction in features of device to allow for more functional communication. As such, skilled ST intervention is required to further instruct and promote functional communication.    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  Cherisa Brucker B. Rutherford Nail M.S., CCC-SLP, [facility] Pathologist Rehabilitation Services Office [phone], [place] 01/12/2022, 7:02 AM  [place] MAIN [facility] SERVICES [address] [place], [place], [address] Phone: [phone]   Fax:  [phone]   Name: Carmen Gonzalez MRN: [id number] Date of Birth: July 28, 1951

## 2022-01-12 NOTE — Patient Instructions (Signed)
Complete activities in [place], continue utilizing SGD

## 2022-01-12 NOTE — Therapy (Signed)
[place] MAIN [facility] SERVICES [address] [place], [place], [address] Phone: [phone]   Fax:  [phone]  Speech Language Pathology Treatment  Patient Details  Name: Carmen Gonzalez MRN: [id number] Date of Birth: [birth year] Referring Provider (SLP): Dr Jennings Books   Encounter Date: 01/12/2022   End of Session - 01/12/22 2236     Visit Number 19    Number of Visits 25    Date for SLP Re-Evaluation 01/24/22    Authorization Type Medicare    Authorization Time Period 10/31/2021 thru 01/24/2022    Authorization - Visit Number 9    Progress Note Due on Visit 10    SLP Start Time 0900    SLP Stop Time  1000    SLP Time Calculation (min) 60 min    Activity Tolerance Patient tolerated treatment well             Past Medical History:  Diagnosis Date   Allergy    Arthritis    Asthma    Cataract cortical, senile    COPD (chronic obstructive pulmonary disease) ([place])    Deviated septum    Diabetes mellitus without complication ([place])    Glaucoma    Hypercholesteremia    Hyperlipidemia    Hypertension    Obesity    Tuberculosis     Past Surgical History:  Procedure Laterality Date   COLONOSCOPY WITH PROPOFOL N/A 01/17/2016   Procedure: COLONOSCOPY WITH PROPOFOL;  Surgeon: Manya Silvas, MD;  Location: [place];  Service: Endoscopy;  Laterality: N/A;   COLONOSCOPY WITH PROPOFOL N/A 05/04/2021   Procedure: COLONOSCOPY WITH PROPOFOL;  Surgeon: Toledo, Benay Pike, MD;  Location: [facility];  Service: Gastroenterology;  Laterality: N/A;   CT ARTHROGRAM KNEE RT WO ([facility] HX)     NASAL FRACTURE SURGERY      There were no vitals filed for this visit.   Subjective Assessment - 01/12/22 2234     Subjective pt pleasant, welcomed her husband into session                   ADULT SLP TREATMENT - 01/12/22 2234       Treatment Provided   Treatment provided Cognitive-Linquistic      Cognitive-Linquistic  Treatment   Treatment focused on Aphasia;Patient/family/caregiver education    Skilled Treatment Skilled treatment session focusedon customizing pt's SGD to be able to express pt's interests/hobbies/information about her pets, places they go shopping and different vacation spots.              SLP Education - 01/12/22 2235     Education Details ways to use SGD in addition to wants/needs and her husband's directives    Person(s) Educated Patient;Spouse    Methods Explanation    Comprehension Verbalized understanding;Need further instruction              SLP Short Term Goals - 12/09/21 1114       SLP SHORT TERM GOAL #1   Title The patient will answer simple biographical questions at 80% accuracy given frequent maximal verbal and maximal visual cues.    Baseline goal achieved, goal revised    Time 10    Period --   sessions   Status Revised      SLP SHORT TERM GOAL #2   Title The patient will add new vocabulary to speech generating device at 80% accuracy given frequent moderate verbal and moderate visual cues in order to communicate wants and  needs.    Baseline goal achieved, goal revised    Time 10    Period --   sessions   Status Revised      SLP SHORT TERM GOAL #3   Title Pt and her husband will create list of 10 functional phrases (scripts) to increase automaticity of daily communication.    Status Achieved              SLP Long Term Goals - 12/09/21 1118       SLP LONG TERM GOAL #1   Title To maximize functional communication across all communication contexts.    Status On-going    Target Date 01/24/22              Plan - 01/12/22 2236     Clinical Impression Statement Pt's aphasia continues to worsen and she was unable to express answer to the "interest checklist." With pt's permission, pt's husband was invited into session. He was instrumental in providing information on their pets, vacation spots etc. Recommend pt's husband continue attending  sessions to allow for most functional customization of pt's device.    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  Ande Therrell B. Rutherford Nail M.S., CCC-SLP, [facility] Pathologist Rehabilitation Services Office [phone], [place] 01/12/2022, 10:40 PM  [place] MAIN [facility] SERVICES [address] [place], [place], [address] Phone: [phone]   Fax:  [phone]   Name: CLAUDELL RHODY MRN: [id number] Date of Birth: 05/18/1951

## 2022-01-13 ENCOUNTER — Encounter: Payer: Medicare HMO | Admitting: Speech Pathology

## 2022-01-17 ENCOUNTER — Other Ambulatory Visit: Payer: Self-pay

## 2022-01-17 ENCOUNTER — Ambulatory Visit: Payer: Medicare HMO | Admitting: Speech Pathology

## 2022-01-17 DIAGNOSIS — R4701 Aphasia: Secondary | ICD-10-CM | POA: Diagnosis not present

## 2022-01-17 DIAGNOSIS — F0280 Dementia in other diseases classified elsewhere without behavioral disturbance: Secondary | ICD-10-CM

## 2022-01-17 NOTE — Patient Instructions (Signed)
Go thru current icons and delete ones that are not appropriate

## 2022-01-17 NOTE — Therapy (Addendum)
[place] MAIN [facility] SERVICES [address] [place], [place], [address] Phone: [phone]   Fax:  [phone]  Speech Language Pathology Treatment Speech Therapy Progress Note   Dates of reporting period  12/08/2021   to   01/17/2022   Patient Details  Name: Carmen Gonzalez MRN: [id number] Date of Birth: 04-25-1951 Referring Provider (SLP): Dr Jennings Books   Encounter Date: 01/17/2022   End of Session - 01/17/22 1716     Visit Number 20    Number of Visits 25    Date for SLP Re-Evaluation 01/24/22    Authorization Type Medicare    Authorization Time Period 10/31/2021 thru 01/24/2022    Authorization - Visit Number 10    Progress Note Due on Visit 10    SLP Start Time 1100    SLP Stop Time  1200    SLP Time Calculation (min) 60 min    Activity Tolerance Patient tolerated treatment well             Past Medical History:  Diagnosis Date   Allergy    Arthritis    Asthma    Cataract cortical, senile    COPD (chronic obstructive pulmonary disease) ([place])    Deviated septum    Diabetes mellitus without complication ([place])    Glaucoma    Hypercholesteremia    Hyperlipidemia    Hypertension    Obesity    Tuberculosis     Past Surgical History:  Procedure Laterality Date   COLONOSCOPY WITH PROPOFOL N/A 01/17/2016   Procedure: COLONOSCOPY WITH PROPOFOL;  Surgeon: Manya Silvas, MD;  Location: [place];  Service: Endoscopy;  Laterality: N/A;   COLONOSCOPY WITH PROPOFOL N/A 05/04/2021   Procedure: COLONOSCOPY WITH PROPOFOL;  Surgeon: Toledo, Benay Pike, MD;  Location: [facility] ENDOSCOPY;  Service: Gastroenterology;  Laterality: N/A;   CT ARTHROGRAM KNEE RT WO ([facility] HX)     NASAL FRACTURE SURGERY      There were no vitals filed for this visit.   Subjective Assessment - 01/17/22 1715     Subjective pt pleasant, husband attended session    Patient is accompained by: Family member    Currently in Pain? No/denies                    ADULT SLP TREATMENT - 01/17/22 0001       Treatment Provided   Treatment provided Cognitive-Linquistic      Cognitive-Linquistic Treatment   Treatment focused on Aphasia;Patient/family/caregiver education    Skilled Treatment Skilled treatment session focusedon customizing pt's SGD to be able to express pt's interests/hobbies/information about her pets, places they go shopping and different vacation spots.              SLP Education - 01/17/22 1716     Education Details adding new icons            SLP Short Term Goals - 01/17/22 1716                SLP SHORT TERM GOAL #1    Title The patient will add new vocabulary to speech generating device at 80% accuracy given frequent moderate verbal and moderate visual cues in order to communicate wants and needs.     Baseline new goal     Time 10     Period --   sessions    Status New          SLP SHORT TERM GOAL #2  Title The patient will add new vocabulary to speech generating device at 80% accuracy given minimal verbal and minimal visual cues in order to communicate wants and needs.     Baseline goal achieved, goal revised     Time 10     Period --   session    Status Revised          SLP SHORT TERM GOAL #3    Title Pt and her husband will create list of 10 functional phrases (scripts) to increase automaticity of daily communication.     Baseline ongoing, list of 5 phrases thus far     Time 10     Period --   sessions    Status On-going                     SLP Long Term Goals - 01/17/2022 1716               SLP LONG TERM GOAL #1    Title Pt will use multimodal communication to express her wants and needs as well as converse with communication partner.     Baseline goal revised to include use of SGD     Time 12     Period Weeks     Status Revised     Target Date 04/19/22                   Plan - 01/17/22 1717     Clinical Impression Statement Pt's aphasia continues to  worsen and she was unable to express answer to the "interest checklist." With pt's permission, pt's husband was invited into session. He was instrumental in providing information on their pets, vacation spots etc. Recommend pt's husband continue attending sessions to allow for most functional customization of pt's device.             Patient will benefit from skilled therapeutic intervention in order to improve the following deficits and impairments:   Aphasia  Primary progressive aphasia [facility])    Problem List There are no problems to display for this patient.  Elisheba Mcdonnell B. Rutherford Nail M.S., CCC-SLP, [facility] Speech-Language Pathologist Rehabilitation Services Office [phone], [place] 01/17/2022, 5:18 PM  [place] MAIN [facility] SERVICES [address] [place], [place], [address] Phone: [phone]   Fax:  [phone]   Name: Carmen Gonzalez MRN: [id number] Date of Birth: 12/24/1950

## 2022-01-19 ENCOUNTER — Other Ambulatory Visit: Payer: Self-pay

## 2022-01-19 ENCOUNTER — Ambulatory Visit: Payer: Medicare HMO | Attending: Neurology | Admitting: Speech Pathology

## 2022-01-19 DIAGNOSIS — R4701 Aphasia: Secondary | ICD-10-CM | POA: Diagnosis present

## 2022-01-19 DIAGNOSIS — F0280 Dementia in other diseases classified elsewhere without behavioral disturbance: Secondary | ICD-10-CM | POA: Insufficient documentation

## 2022-01-19 DIAGNOSIS — G3101 Pick's disease: Secondary | ICD-10-CM

## 2022-01-20 ENCOUNTER — Encounter: Payer: Medicare HMO | Admitting: Speech Pathology

## 2022-01-20 NOTE — Patient Instructions (Signed)
Get new pictures of grandchildren ?

## 2022-01-20 NOTE — Therapy (Addendum)
[place] ?[place] [facility] ?[place], [place], [address] ?Phone: [phone]   Fax:  [phone] ? ?Speech Language Pathology Treatment ? ?Patient Details  ?Name: Carmen Gonzalez ?MRN: [id number] ?Date of Birth: 1951-11-09 ?Referring Provider (SLP): Dr Jennings Books ? ? ?Encounter Date: 01/19/2022 ? ? End of Session - 01/20/22 1551   ? ? Visit Number 21   ? Number of Visits 25   ? Date for SLP Re-Evaluation 01/24/22   ? Authorization Type Medicare   ? Authorization Time Period 10/31/2021 thru 01/24/2022   ? Authorization - Visit Number 1   ? Progress Note Due on Visit 10   ? SLP Start Time 0900   ? SLP Stop Time  1000   ? SLP Time Calculation (min) 60 min   ? Activity Tolerance Patient tolerated treatment well   ? ?  ?  ? ?  ? ? ?Past Medical History:  ?Diagnosis Date  ? Allergy   ? Arthritis   ? Asthma   ? Cataract cortical, senile   ? COPD (chronic obstructive pulmonary disease) ([place])   ? Deviated septum   ? Diabetes mellitus without complication ([place])   ? Glaucoma   ? Hypercholesteremia   ? Hyperlipidemia   ? Hypertension   ? Obesity   ? Tuberculosis   ? ? ?Past Surgical History:  ?Procedure Laterality Date  ? COLONOSCOPY WITH PROPOFOL N/A 01/17/2016  ? Procedure: COLONOSCOPY WITH PROPOFOL;  Surgeon: Manya Silvas, MD;  Location: [facility] ENDOSCOPY;  Service: Endoscopy;  Laterality: N/A;  ? COLONOSCOPY WITH PROPOFOL N/A 05/04/2021  ? Procedure: COLONOSCOPY WITH PROPOFOL;  Surgeon: Toledo, Benay Pike, MD;  Location: [facility] ENDOSCOPY;  Service: Gastroenterology;  Laterality: N/A;  ? CT ARTHROGRAM KNEE RT WO ([place] HX)    ? NASAL FRACTURE SURGERY    ? ? ?There were no vitals filed for this visit. ? ? Subjective Assessment - 01/20/22 1548   ? ? Subjective pt pleasant, husband attended session   ? Patient is accompained by: Family member   ? Currently in Pain? No/denies   ? ?  ?  ? ?  ? ? ? ? ? ? ? ? ADULT SLP TREATMENT - 01/20/22 0001   ? ?  ? Treatment Provided  ?  Treatment provided Cognitive-Linquistic   ?  ? Cognitive-Linquistic Treatment  ? Treatment focused on Aphasia;Patient/family/caregiver education   ? Skilled Treatment Pt and her husband assisted in providing information and customizing SGD. The following were added to promote functional communication relative to pt: MAKING APPOINTMENTS: I need an appointment with my doctor, Mimi, with my neurologist, Dr Jennings Books, speech therapist, Nailani Full, dentist, eye doctor Dr Cephus Shelling; when is my next appointment - FAMILY, MY HUSBAND BOB: 1.) anniversary, On June 2 we will be married 12 years, 2.) RV, we have an RV and love to travel, 3.) favorite places, we love to go to [place], [place] and [place], 4.) dog shows, we showed [place] and [place] for 20 years, 5.) 2006, we moved from [place] to [place] in 2006 - FAMILY, DAUGHTER LAUREN: 1.) Ander Purpura is our oldest daughter, 2.) she has 4 children, 3.) Luisa Hart is her second oldest son, he loves computers - personal pictures were used to help convey communicative functions of icon   ? ?  ?  ? ?  ? ? ? SLP Education - 01/20/22 1551   ? ? Education Details adding new information to SGD   ?  Person(s) Educated Patient;Spouse   ? Methods Explanation;Demonstration;Verbal cues;Handout   ? Comprehension Verbalized understanding;Need further instruction;Verbal cues required   ? ?  ?  ? ?  ? ?SLP Short Term Goals - 01/20/22 1551  ?  ?    ?     ?  SLP SHORT TERM GOAL #1  ?  Title The patient will add new vocabulary to speech generating device at 80% accuracy given frequent moderate verbal and moderate visual cues in order to communicate wants and needs.   ?  Baseline new goal   ?  Time 10   ?  Period --   sessions  ?  Status New   ?     ?  SLP SHORT TERM GOAL #2  ?  Title The patient will add new vocabulary to speech generating device at 80% accuracy given minimal verbal and minimal visual cues in order to communicate wants and needs.   ?  Baseline goal achieved, goal  revised   ?  Time 10   ?  Period --   session  ?  Status Revised   ?     ?  SLP SHORT TERM GOAL #3  ?  Title Pt and her husband will create list of 10 functional phrases (scripts) to increase automaticity of daily communication.   ?  Baseline ongoing, list of 5 phrases thus far   ?  Time 10   ?  Period --   sessions  ?  Status On-going   ?  ?   ?  ?  ?   ?  ?  ?  SLP Long Term Goals - 01/20/22 1551   ?  ?    ?     ?  SLP LONG TERM GOAL #1  ?  Title Pt will use multimodal communication to express her wants and needs as well as converse with communication partner.   ?  Baseline goal revised to include use of SGD   ?  Time 12   ?  Period Weeks   ?  Status Revised   ?  Target Date 04/19/22   ?     ?  ? ? ? ? ? ? ? ? Plan - 01/20/22 1552   ? ? Clinical Impression Statement Pt eagerly engaged in customizing SGd to include conversational topics, not just communication of wants/needs. Pt and her husband continue with appropriate questions about device and disease trajectory. As such skilled St intervention continues to be required to improve functional communication to continue pt's independence.   ? Speech Therapy Frequency 2x / week   ? Duration 12 weeks   ? Treatment/Interventions Language facilitation;Patient/family education;Compensatory strategies;SLP instruction and feedback   ? Potential to Achieve Goals Good   ? Potential Considerations Severity of impairments;Medical prognosis   ? SLP Home Exercise Plan provided, see pt instructions section   ? Consulted and Agree with Plan of Care Patient;Family member/caregiver   ? Family Member Consulted pt's husband   ? ?  ?  ? ?  ? ? ?Patient will benefit from skilled therapeutic intervention in order to improve the following deficits and impairments:   ?Aphasia ? ?Primary progressive aphasia ([place]) ? ? ? ?Problem List ?There are no problems to display for this patient. ? ?Nakai Yard B. Rutherford Nail, M.S., CCC-SLP, CBIS ?Speech-Language Pathologist ?Rehabilitation Services ?Office  [phone] ? ?[place], CCC-SLP ?01/20/2022, 4:29 PM ? ?[place] ?[place] [facility] REH[facility] SERVICES ?[place], [place], [address] ?  Phone: [phone]   Fax:  [phone] ? ? ?Name: Carmen Gonzalez ?MRN: [id number] ?Date of Birth: 21-Oct-1951 ? ?

## 2022-01-24 ENCOUNTER — Ambulatory Visit: Payer: Medicare HMO | Admitting: Speech Pathology

## 2022-01-24 ENCOUNTER — Other Ambulatory Visit: Payer: Self-pay

## 2022-01-24 DIAGNOSIS — R4701 Aphasia: Secondary | ICD-10-CM | POA: Diagnosis not present

## 2022-01-24 DIAGNOSIS — F0280 Dementia in other diseases classified elsewhere without behavioral disturbance: Secondary | ICD-10-CM

## 2022-01-25 NOTE — Therapy (Signed)
[place] ?[place] [facility] REH[facility] SERVICES ?[place], [place], [address] ?Phone: [phone]   Fax:  [phone] ? ?Speech Language Pathology Treatment ?RE-CETIFICATION REQUEST ? ?Patient Details  ?Name: Carmen Gonzalez ?MRN: [id number] ?Date of Birth: [birth year] ?Referring Provider (SLP): Dr Jennings Books ? ? ?Encounter Date: 01/24/2022 ? ? End of Session - 01/25/22 2585   ? ? Visit Number 22   ? Number of Visits 46   ? Date for SLP Re-Evaluation 04/19/22   ? Authorization Type Medicare   ? Authorization Time Period 01/24/2022 thru 04/19/2022   ? Authorization - Visit Number 2   ? Progress Note Due on Visit 10   ? SLP Start Time 1000   ? SLP Stop Time  1100   ? SLP Time Calculation (min) 60 min   ? Activity Tolerance Patient tolerated treatment well   ? ?  ?  ? ?  ? ? ?Past Medical History:  ?Diagnosis Date  ? Allergy   ? Arthritis   ? Asthma   ? Cataract cortical, senile   ? COPD (chronic obstructive pulmonary disease) ([place])   ? Deviated septum   ? Diabetes mellitus without complication ([place])   ? Glaucoma   ? Hypercholesteremia   ? Hyperlipidemia   ? Hypertension   ? Obesity   ? Tuberculosis   ? ? ?Past Surgical History:  ?Procedure Laterality Date  ? COLONOSCOPY WITH PROPOFOL N/A 01/17/2016  ? Procedure: COLONOSCOPY WITH PROPOFOL;  Surgeon: Manya Silvas, MD;  Location: [facility] ENDOSCOPY;  Service: Endoscopy;  Laterality: N/A;  ? COLONOSCOPY WITH PROPOFOL N/A 05/04/2021  ? Procedure: COLONOSCOPY WITH PROPOFOL;  Surgeon: Toledo, Benay Pike, MD;  Location: [facility] ENDOSCOPY;  Service: Gastroenterology;  Laterality: N/A;  ? CT ARTHROGRAM KNEE RT WO ([place] HX)    ? NASAL FRACTURE SURGERY    ? ? ?There were no vitals filed for this visit. ? ? Subjective Assessment - 01/25/22 0848   ? ? Subjective pt pleasant, husband attended session - "I left it at home" referring to her SGD   ? Patient is accompained by: Family member   ? Currently in Pain? No/denies   ? ?  ?  ? ?  ? ? ? ? ? ? ? ? ADULT  SLP TREATMENT - 01/25/22 0001   ? ?  ? Treatment Provided  ? Treatment provided Cognitive-Linquistic   ?  ? Cognitive-Linquistic Treatment  ? Treatment focused on Aphasia;Patient/family/caregiver education   ? Skilled Treatment Skilled treatment session focused on instructing pt on adding icons and communicative utterances using clinic SGD. In addition to verbal instruction, SLP took pictures of the device at each stepand then printed to assist pt loading 3 new utterances at home.   ? ?  ?  ? ?  ? ? ? SLP Education - 01/25/22 0852   ? ? Education Details adding new information/icons to SGD   ? Person(s) Educated Patient;Spouse   ? Methods Explanation;Demonstration;Verbal cues;Handout   ? Comprehension Verbalized understanding;Returned demonstration;Need further instruction;Verbal cues required;Tactile cues required   ? ?  ?  ? ?  ? ? ? SLP Short Term Goals - 01/25/22 0855   ? ?  ? SLP SHORT TERM GOAL #1  ? Title The patient will add new vocabulary to speech generating device at 80% accuracy given frequent moderate verbal and moderate visual cues in order to communicate wants and needs.   ? Baseline new goal   ? Time 10   ?  Period --   sessions  ? Status New   ?  ? SLP SHORT TERM GOAL #2  ? Title The patient will add new vocabulary to speech generating device at 80% accuracy given minimal verbal and minimal visual cues in order to communicate wants and needs.   ? Baseline goal achieved, goal revised   ? Time 10   ? Period --   session  ? Status Revised   ?  ? SLP SHORT TERM GOAL #3  ? Title Pt and her husband will create list of 10 functional phrases (scripts) to increase automaticity of daily communication.   ? Baseline ongoing, list of 5 phrases thus far   ? Time 10   ? Period --   sessions  ? Status On-going   ? ?  ?  ? ?  ? ? ? SLP Long Term Goals - 01/25/22 0858   ? ?  ? SLP LONG TERM GOAL #1  ? Title Pt will use multimodal communication to express her wants and needs as well as converse with communication  partner.   ? Baseline goal revised to include use of SGD   ? Time 12   ? Period Weeks   ? Status Revised   ? Target Date 04/19/22   ?  ? SLP LONG TERM GOAL #3  ? Status --   pt with progressive language deficits  ? ?  ?  ? ?  ? ? ? Plan - 01/25/22 0853   ? ? Clinical Impression Statement Pt continues to respond well to therapeutic activities as well as use of SGD. Pt's husband has started attending sessions to provided information on functional information to program into pt's device. Skilled ST intervention continues to required to complete instruction in programming device. Will request re-certification.   ? Speech Therapy Frequency 2x / week   ? Duration 12 weeks   ? Treatment/Interventions Language facilitation;Patient/family education;Compensatory strategies;SLP instruction and feedback   ? Potential to Achieve Goals Good   ? Potential Considerations Severity of impairments;Medical prognosis   ? SLP Home Exercise Plan provided, see pt instructions section   ? Consulted and Agree with Plan of Care Patient;Family member/caregiver   ? Family Member Consulted pt's husband   ? ?  ?  ? ?  ? ? ?Patient will benefit from skilled therapeutic intervention in order to improve the following deficits and impairments:   ?Aphasia ? ?Primary progressive aphasia ([place]) ? ? ? ?Problem List ?There are no problems to display for this patient. ? ?Latorie Montesano B. Rutherford Nail, M.S., CCC-SLP, CBIS ?Speech-Language Pathologist ?Rehabilitation Services ?Office [phone] ? ?Kennis Buell, CCC-SLP ?01/25/2022, 9:00 AM ? ?[place] ?[place] [facility] ?[place], [place], [address] ?Phone: [phone]   Fax:  [phone] ? ? ?Name: Carmen Gonzalez ?MRN: [id number] ?Date of Birth: 06-Dec-1950 ? ?

## 2022-01-25 NOTE — Patient Instructions (Signed)
Using the instructions provided in the session - load 3 provided functional utterances ?

## 2022-01-25 NOTE — Addendum Note (Signed)
Addended by: Stormy Fabian B on: 01/25/2022 02:41 PM ? ? Modules accepted: Orders ? ?

## 2022-01-26 ENCOUNTER — Ambulatory Visit: Payer: Medicare HMO | Admitting: Speech Pathology

## 2022-01-26 ENCOUNTER — Other Ambulatory Visit: Payer: Self-pay

## 2022-01-26 DIAGNOSIS — F0280 Dementia in other diseases classified elsewhere without behavioral disturbance: Secondary | ICD-10-CM

## 2022-01-26 DIAGNOSIS — R4701 Aphasia: Secondary | ICD-10-CM | POA: Diagnosis not present

## 2022-01-27 NOTE — Therapy (Signed)
[place] ?[place] [facility] RE[facility] ?[place], [place], [address] ?Phone: [phone]   Fax:  [phone] ? ?Speech Language Pathology Treatment ? ?Patient Details  ?Name: Carmen Gonzalez ?MRN: [id number] ?Date of Birth: 08-08-1951 ?Referring Provider (SLP): Dr Jennings Books ? ? ?Encounter Date: 01/26/2022 ? ? End of Session - 01/27/22 1225   ? ? Visit Number 23   ? Number of Visits 46   ? Date for SLP Re-Evaluation 04/19/22   ? Authorization Type Medicare   ? Authorization Time Period 01/24/2022 thru 04/19/2022   ? Authorization - Visit Number 3   ? Progress Note Due on Visit 10   ? SLP Start Time 1000   ? SLP Stop Time  1100   ? SLP Time Calculation (min) 60 min   ? Activity Tolerance Patient tolerated treatment well   ? ?  ?  ? ?  ? ? ?Past Medical History:  ?Diagnosis Date  ? Allergy   ? Arthritis   ? Asthma   ? Cataract cortical, senile   ? COPD (chronic obstructive pulmonary disease) ([place])   ? Deviated septum   ? Diabetes mellitus without complication ([place])   ? Glaucoma   ? Hypercholesteremia   ? Hyperlipidemia   ? Hypertension   ? Obesity   ? Tuberculosis   ? ? ?Past Surgical History:  ?Procedure Laterality Date  ? COLONOSCOPY WITH PROPOFOL N/A 01/17/2016  ? Procedure: COLONOSCOPY WITH PROPOFOL;  Surgeon: Manya Silvas, MD;  Location: [facility] ENDOSCOPY;  Service: Endoscopy;  Laterality: N/A;  ? COLONOSCOPY WITH PROPOFOL N/A 05/04/2021  ? Procedure: COLONOSCOPY WITH PROPOFOL;  Surgeon: Toledo, Benay Pike, MD;  Location: [facility] ENDOSCOPY;  Service: Gastroenterology;  Laterality: N/A;  ? CT ARTHROGRAM KNEE RT WO ([place] HX)    ? NASAL FRACTURE SURGERY    ? ? ?There were no vitals filed for this visit. ? ? Subjective Assessment - 01/27/22 1221   ? ? Subjective "I remembered today"   ? Patient is accompained by: Family member   ? Currently in Pain? No/denies   ? ?  ?  ? ?  ? ? ? ? ? ? ? ? ADULT SLP TREATMENT - 01/27/22 0001   ? ?  ? Treatment Provided  ? Treatment provided  Cognitive-Linquistic   ?  ? Cognitive-Linquistic Treatment  ? Treatment focused on Aphasia;Patient/family/caregiver education   ? Skilled Treatment Skilled treatment session focused on instructing pt on adding new icons and functional utterances. Pt required moderate faded to minimal cues and with repetition, pt became more effecient.   ? ?  ?  ? ?  ? ? ? SLP Education - 01/27/22 1225   ? ? Education Details using edit, photos as icon pictures   ? Person(s) Educated Patient;Spouse   ? Methods Explanation;Demonstration;Verbal cues;Handout   ? Comprehension Verbalized understanding;Returned demonstration;Verbal cues required;Need further instruction   ? ?  ?  ? ?  ? ? ? SLP Short Term Goals - 01/25/22 0855   ? ?  ? SLP SHORT TERM GOAL #1  ? Title The patient will add new vocabulary to speech generating device at 80% accuracy given frequent moderate verbal and moderate visual cues in order to communicate wants and needs.   ? Baseline new goal   ? Time 10   ? Period --   sessions  ? Status New   ?  ? SLP SHORT TERM GOAL #2  ? Title The patient will add new vocabulary  to speech generating device at 80% accuracy given minimal verbal and minimal visual cues in order to communicate wants and needs.   ? Baseline goal achieved, goal revised   ? Time 10   ? Period --   session  ? Status Revised   ?  ? SLP SHORT TERM GOAL #3  ? Title Pt and her husband will create list of 10 functional phrases (scripts) to increase automaticity of daily communication.   ? Baseline ongoing, list of 5 phrases thus far   ? Time 10   ? Period --   sessions  ? Status On-going   ? ?  ?  ? ?  ? ? ? SLP Long Term Goals - 01/25/22 0858   ? ?  ? SLP LONG TERM GOAL #1  ? Title Pt will use multimodal communication to express her wants and needs as well as converse with communication partner.   ? Baseline goal revised to include use of SGD   ? Time 12   ? Period Weeks   ? Status Revised   ? Target Date 04/19/22   ?  ? SLP LONG TERM GOAL #3  ? Status --   pt  with progressive language deficits  ? ?  ?  ? ?  ? ? ? Plan - 01/27/22 1226   ? ? Clinical Impression Statement Pt continues to struggle with adding new icons and communicative utterances. Skilled ST intervention continues to required to complete instruction in programming device.   ? Speech Therapy Frequency 2x / week   ? Duration 12 weeks   ? Treatment/Interventions Language facilitation;Patient/family education;Compensatory strategies;SLP instruction and feedback   ? Potential to Achieve Goals Good   ? Potential Considerations Severity of impairments;Medical prognosis   ? SLP Home Exercise Plan provided, see pt instructions section   ? Consulted and Agree with Plan of Care Patient;Family member/caregiver   ? Family Member Consulted pt's husband   ? ?  ?  ? ?  ? ? ?Patient will benefit from skilled therapeutic intervention in order to improve the following deficits and impairments:   ?Aphasia ? ?Primary progressive aphasia ([place]) ? ? ? ?Problem List ?There are no problems to display for this patient. ? ?Carylon Tamburro B. Rutherford Nail, M.S., CCC-SLP, CBIS ?Speech-Language Pathologist ?Rehabilitation Services ?Office [phone] ? ?[place], [place] ?01/27/2022, 12:27 PM ? ?[place] ?[place] [facility] ?[place], [place], [address] ?Phone: [phone]   Fax:  [phone] ? ? ?Name: Carmen Gonzalez ?MRN: [id number] ?Date of Birth: April 30, 1951 ? ?

## 2022-01-27 NOTE — Patient Instructions (Signed)
Continue adding functional communication icons ?

## 2022-01-31 ENCOUNTER — Other Ambulatory Visit: Payer: Self-pay

## 2022-01-31 ENCOUNTER — Ambulatory Visit: Payer: Medicare HMO | Admitting: Speech Pathology

## 2022-01-31 DIAGNOSIS — R4701 Aphasia: Secondary | ICD-10-CM

## 2022-01-31 DIAGNOSIS — F0280 Dementia in other diseases classified elsewhere without behavioral disturbance: Secondary | ICD-10-CM

## 2022-01-31 NOTE — Patient Instructions (Signed)
Add specified communication utterances ?

## 2022-01-31 NOTE — Therapy (Signed)
[place] ?[facility] ?[place], [place], [address] ?Phone: [phone]   Fax:  [phone] ? ?Speech Language Pathology Treatment ? ?Patient Details  ?Name: Carmen Gonzalez ?MRN: [id number] ?Date of Birth: 27-Mar-1951 ?Referring Provider (SLP): Dr Jennings Books ? ? ?Encounter Date: 01/31/2022 ? ? End of Session - 01/31/22 1556   ? ? Visit Number 24   ? Number of Visits 46   ? Date for SLP Re-Evaluation 04/19/22   ? Authorization Type Medicare   ? Authorization Time Period 01/24/2022 thru 04/19/2022   ? Authorization - Visit Number 4   ? Progress Note Due on Visit 10   ? SLP Start Time 1000   ? SLP Stop Time  1100   ? SLP Time Calculation (min) 60 min   ? Activity Tolerance Patient tolerated treatment well   ? ?  ?  ? ?  ? ? ?Past Medical History:  ?Diagnosis Date  ? Allergy   ? Arthritis   ? Asthma   ? Cataract cortical, senile   ? COPD (chronic obstructive pulmonary disease) ([place])   ? Deviated septum   ? Diabetes mellitus without complication ([place])   ? Glaucoma   ? Hypercholesteremia   ? Hyperlipidemia   ? Hypertension   ? Obesity   ? Tuberculosis   ? ? ?Past Surgical History:  ?Procedure Laterality Date  ? COLONOSCOPY WITH PROPOFOL N/A 01/17/2016  ? Procedure: COLONOSCOPY WITH PROPOFOL;  Surgeon: Manya Silvas, MD;  Location: [facility] END[facility]COPY;  Service: Endoscopy;  Laterality: N/A;  ? COLONOSCOPY WITH PROPOFOL N/A 05/04/2021  ? Procedure: COLONOSCOPY WITH PROPOFOL;  Surgeon: Toledo, Benay Pike, MD;  Location: [facility] ENDOSCOPY;  Service: Gastroenterology;  Laterality: N/A;  ? CT ARTHROGRAM KNEE RT WO ([place] HX)    ? NASAL FRACTURE SURGERY    ? ? ?There were no vitals filed for this visit. ? ? Subjective Assessment - 01/31/22 1536   ? ? Subjective "I couldn't take a photo on it (SGD) so I used my cell phone"   ? Patient is accompained by: Family member   ? Currently in Pain? No/denies   ? ?  ?  ? ?  ? ? ? ? ? ? ? ? ADULT SLP TREATMENT - 01/31/22 0001   ? ?  ?  Treatment Provided  ? Treatment provided Cognitive-Linquistic   ?  ? Cognitive-Linquistic Treatment  ? Treatment focused on Aphasia;Patient/family/caregiver education   ? Skilled Treatment Skilled treatment session focused on improving pt's functional communication.  ? ?SLP facilitated session by providing the following interventions:     ? ?Customization of SGD: SLP provided pt with [employer]'s User's Guide; instruction provided on how to take photo using device as well as the PHOTOS folder; SLP assisted with device settings to allow folders to speak; also instructed pt with use of user guide in backing up device; SLP also provided customization of devices targeting medical symptoms, finances, questions for ease of use   ? ?  ?  ? ?  ? ? ? SLP Education - 01/31/22 1555   ? ? Education Details user guide, photos folder   ? Person(s) Educated Patient;Spouse   ? Methods Explanation;Demonstration;Verbal cues;Handout   ? Comprehension Verbalized understanding;Returned demonstration;Need further instruction;Verbal cues required   ? ?  ?  ? ?  ? ? ? SLP Short Term Goals - 01/25/22 0855   ? ?  ? SLP SHORT TERM GOAL #1  ? Title The patient will  add new vocabulary to speech generating device at 80% accuracy given frequent moderate verbal and moderate visual cues in order to communicate wants and needs.   ? Baseline new goal   ? Time 10   ? Period --   sessions  ? Status New   ?  ? SLP SHORT TERM GOAL #2  ? Title The patient will add new vocabulary to speech generating device at 80% accuracy given minimal verbal and minimal visual cues in order to communicate wants and needs.   ? Baseline goal achieved, goal revised   ? Time 10   ? Period --   session  ? Status Revised   ?  ? SLP SHORT TERM GOAL #3  ? Title Pt and her husband will create list of 10 functional phrases (scripts) to increase automaticity of daily communication.   ? Baseline ongoing, list of 5 phrases thus far   ? Time 10   ? Period --   sessions  ? Status  On-going   ? ?  ?  ? ?  ? ? ? SLP Long Term Goals - 01/25/22 0858   ? ?  ? SLP LONG TERM GOAL #1  ? Title Pt will use multimodal communication to express her wants and needs as well as converse with communication partner.   ? Baseline goal revised to include use of SGD   ? Time 12   ? Period Weeks   ? Status Revised   ? Target Date 04/19/22   ?  ? SLP LONG TERM GOAL #3  ? Status --   pt with progressive language deficits  ? ?  ?  ? ?  ? ? ? Plan - 01/31/22 1556   ? ? 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 In addition pt requires moderate assistance with use of SGD. As such, skilled ST intervention continues to be recommended to increase pt's confidence with using device.   ? Speech Therapy Frequency 2x / week   ? Duration 12 weeks   ? Treatment/Interventions Language facilitation;Patient/family education;Compensatory strategies;SLP instruction and feedback   ? Potential to Achieve Goals Good   ? Potential Considerations Severity of impairments;Medical prognosis   ? SLP Home Exercise Plan provided, see pt instructions section   ? Consulted and Agree with Plan of Care Patient;Family member/caregiver   ? Family Member Consulted pt's husband   ? ?  ?  ? ?  ? ? ?Patient will benefit from skilled therapeutic intervention in order to improve the following deficits and impairments:   ?Aphasia ? ?Primary progressive aphasia ([place]) ? ? ? ?Problem List ?There are no problems to display for this patient. ? ?Kemiah Booz B. Rutherford Nail, M.S., CCC-SLP, CBIS ?Speech-Language Pathologist ?Rehabilitation Services ?Office [phone] ? ?[place], [place] ?01/31/2022, 3:56 PM ? ?[place] ?[facility] ?[place], [place], [address] ?Phone: [phone]   Fax:  [phone] ? ? ?Name: Carmen Gonzalez ?MRN: [id number] ?Date of Birth: 09/29/1951 ? ?

## 2022-02-02 ENCOUNTER — Ambulatory Visit: Payer: Medicare HMO | Admitting: Speech Pathology

## 2022-02-02 ENCOUNTER — Other Ambulatory Visit: Payer: Self-pay

## 2022-02-02 DIAGNOSIS — R4701 Aphasia: Secondary | ICD-10-CM

## 2022-02-02 DIAGNOSIS — F0280 Dementia in other diseases classified elsewhere without behavioral disturbance: Secondary | ICD-10-CM

## 2022-02-02 NOTE — Patient Instructions (Signed)
Fill out written sheet for 4 communicative messages before adding to her SGD ?

## 2022-02-03 ENCOUNTER — Ambulatory Visit: Payer: Medicare HMO | Admitting: Speech Pathology

## 2022-02-03 NOTE — Therapy (Signed)
[place] ?[facility] ?[place], [place], [address] ?Phone: [phone]   Fax:  [phone] ? ?Speech Language Pathology Treatment ? ?Patient Details  ?Name: Carmen Gonzalez ?MRN: [id number] ?Date of Birth: 21-Jun-1951 ?Referring Provider (SLP): Dr Jennings Books ? ? ?Encounter Date: 02/02/2022 ? ? End of Session - 02/03/22 0738   ? ? Visit Number 25   ? Number of Visits 46   ? Date for SLP Re-Evaluation 04/19/22   ? Authorization Type Medicare   ? Authorization Time Period 01/24/2022 thru 04/19/2022   ? Authorization - Visit Number 5   ? Progress Note Due on Visit 10   ? SLP Start Time 1000   ? SLP Stop Time  1100   ? SLP Time Calculation (min) 60 min   ? Activity Tolerance Patient tolerated treatment well   ? ?  ?  ? ?  ? ? ?Past Medical History:  ?Diagnosis Date  ? Allergy   ? Arthritis   ? Asthma   ? Cataract cortical, senile   ? COPD (chronic obstructive pulmonary disease) ([place])   ? Deviated septum   ? Diabetes mellitus without complication ([place])   ? Glaucoma   ? Hypercholesteremia   ? Hyperlipidemia   ? Hypertension   ? Obesity   ? Tuberculosis   ? ? ?Past Surgical History:  ?Procedure Laterality Date  ? COLONOSCOPY WITH PROPOFOL N/A 01/17/2016  ? Procedure: COLONOSCOPY WITH PROPOFOL;  Surgeon: Manya Silvas, MD;  Location: [facility] ENDOSCOPY;  Service: Endoscopy;  Laterality: N/A;  ? COLONOSCOPY WITH PROPOFOL N/A 05/04/2021  ? Procedure: COLONOSCOPY WITH PROPOFOL;  Surgeon: Toledo, Benay Pike, MD;  Location: [facility] ENDOSCOPY;  Service: Gastroenterology;  Laterality: N/A;  ? CT ARTHROGRAM KNEE RT WO ([place] HX)    ? NASAL FRACTURE SURGERY    ? ? ?There were no vitals filed for this visit. ? ? Subjective Assessment - 02/02/22 1951   ? ? Subjective pt pleasant, laughing with husband present   ? Patient is accompained by: Family member   ? Currently in Pain? No/denies   ? ?  ?  ? ?  ? ? ? ? ? ? ? ? ADULT SLP TREATMENT - 02/02/22 0001   ? ?  ? Cognitive-Linquistic  Treatment  ? Treatment focused on Aphasia;Patient/family/caregiver education   ? Skilled Treatment Skilled treatment session focused on improving pt's functional communication. SLP facilitated session by providing the following interventions:    Customization of SGD:   Moderate assistance to edit picture of her pet; editing [place]: including deleting icons and moderate assistance to add/edit functional icons; printed worksheet created to help pt list communicative utterance; identify key words for searching icon [place], text under icon as well as information for icon to say; this greatly improve pt's efficiency from moderate assistance to intermittent min assistance   ? ?  ?  ? ?  ? ? ? SLP Education - 02/03/22 0736   ? ? Education Details programming SGD   ? Person(s) Educated Patient;Spouse   ? Methods Explanation;Demonstration;Verbal cues;Handout;Tactile cues   ? Comprehension Verbalized understanding;Returned demonstration;Need further instruction   ? ?  ?  ? ?  ? ? ? SLP Short Term Goals - 01/25/22 0855   ? ?  ? SLP SHORT TERM GOAL #1  ? Title The patient will add new vocabulary to speech generating device at 80% accuracy given frequent moderate verbal and moderate visual cues in order to communicate wants and needs.   ?  Baseline new goal   ? Time 10   ? Period --   sessions  ? Status New   ?  ? SLP SHORT TERM GOAL #2  ? Title The patient will add new vocabulary to speech generating device at 80% accuracy given minimal verbal and minimal visual cues in order to communicate wants and needs.   ? Baseline goal achieved, goal revised   ? Time 10   ? Period --   session  ? Status Revised   ?  ? SLP SHORT TERM GOAL #3  ? Title Pt and her husband will create list of 10 functional phrases (scripts) to increase automaticity of daily communication.   ? Baseline ongoing, list of 5 phrases thus far   ? Time 10   ? Period --   sessions  ? Status On-going   ? ?  ?  ? ?  ? ? ? SLP Long Term Goals - 01/25/22 0858   ? ?  ?  SLP LONG TERM GOAL #1  ? Title Pt will use multimodal communication to express her wants and needs as well as converse with communication partner.   ? Baseline goal revised to include use of SGD   ? Time 12   ? Period Weeks   ? Status Revised   ? Target Date 04/19/22   ?  ? SLP LONG TERM GOAL #3  ? Status --   pt with progressive language deficits  ? ?  ?  ? ?  ? ? ? Plan - 02/03/22 0738   ? ? 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 In addition pt requires moderate assistance with use of SGD. As such, skilled ST intervention continues to be recommended to increase pt's confidence with using device.   ? Speech Therapy Frequency 2x / week   ? Duration 12 weeks   ? Treatment/Interventions Language facilitation;Patient/family education;Compensatory strategies;SLP instruction and feedback   ? Potential to Achieve Goals Good   ? Potential Considerations Severity of impairments;Medical prognosis   ? SLP Home Exercise Plan provided, see pt instructions section   ? Consulted and Agree with Plan of Care Patient;Family member/caregiver   ? Family Member Consulted pt's husband   ? ?  ?  ? ?  ? ? ?Patient will benefit from skilled therapeutic intervention in order to improve the following deficits and impairments:   ?Aphasia ? ?Primary progressive aphasia ([place]) ? ? ? ?Problem List ?There are no problems to display for this patient. ? ?Kasiyah Platter B. Rutherford Nail, M.S., CCC-SLP, CBIS ?Speech-Language Pathologist ?Rehabilitation Services ?Office [phone] ? ?[place], CCC-SLP ?02/03/2022, 7:39 AM ? ?[place] ?[facility] ?[place], [place], [address] ?Phone: [phone]   Fax:  [phone] ? ? ?Name: TEMECA SOMMA ?MRN: [id number] ?Date of Birth: 1951-05-05 ? ?

## 2022-02-07 ENCOUNTER — Other Ambulatory Visit: Payer: Self-pay

## 2022-02-07 ENCOUNTER — Ambulatory Visit: Payer: Medicare HMO | Admitting: Speech Pathology

## 2022-02-07 DIAGNOSIS — R4701 Aphasia: Secondary | ICD-10-CM | POA: Diagnosis not present

## 2022-02-07 DIAGNOSIS — F0280 Dementia in other diseases classified elsewhere without behavioral disturbance: Secondary | ICD-10-CM

## 2022-02-07 NOTE — Therapy (Signed)
[place] ?[facility] ?[place], [place], [address] ?Phone: [phone]   Fax:  [phone] ? ?Speech Language Pathology Treatment ? ?Patient Details  ?Name: Carmen Gonzalez ?MRN: [id number] ?Date of Birth: October 21, 1951 ?Referring Provider (SLP): Dr Jennings Books ? ? ?Encounter Date: 02/07/2022 ? ? End of Session - 02/07/22 1958   ? ? Visit Number 26   ? Number of Visits 46   ? Date for SLP Re-Evaluation 04/19/22   ? Authorization Type Medicare   ? Authorization Time Period 01/24/2022 thru 04/19/2022   ? Authorization - Visit Number 6   ? Progress Note Due on Visit 10   ? SLP Start Time 1000   ? SLP Stop Time  1045   ? SLP Time Calculation (min) 45 min   ? Activity Tolerance Patient tolerated treatment well   ? ?  ?  ? ?  ? ? ?Past Medical History:  ?Diagnosis Date  ? Allergy   ? Arthritis   ? Asthma   ? Cataract cortical, senile   ? COPD (chronic obstructive pulmonary disease) ([place])   ? Deviated septum   ? Diabetes mellitus without complication ([place])   ? Glaucoma   ? Hypercholesteremia   ? Hyperlipidemia   ? Hypertension   ? Obesity   ? Tuberculosis   ? ? ?Past Surgical History:  ?Procedure Laterality Date  ? COLONOSCOPY WITH PROPOFOL N/A 01/17/2016  ? Procedure: COLONOSCOPY WITH PROPOFOL;  Surgeon: Manya Silvas, MD;  Location: [facility] ENDOSCOPY;  Service: Endoscopy;  Laterality: N/A;  ? COLONOSCOPY WITH PROPOFOL N/A 05/04/2021  ? Procedure: COLONOSCOPY WITH PROPOFOL;  Surgeon: Toledo, Benay Pike, MD;  Location: [facility] ENDOSCOPY;  Service: Gastroenterology;  Laterality: N/A;  ? CT ARTHROGRAM KNEE RT WO ([place] HX)    ? NASAL FRACTURE SURGERY    ? ? ?There were no vitals filed for this visit. ? ? Subjective Assessment - 02/07/22 1956   ? ? Subjective "We had a great weekend"   ? Patient is accompained by: Family member   ? Currently in Pain? No/denies   ? ?  ?  ? ?  ? ? ? ? ? ? ? ? ADULT SLP TREATMENT - 02/07/22 0001   ? ?  ? Treatment Provided  ? Treatment provided  Cognitive-Linquistic   ?  ? Cognitive-Linquistic Treatment  ? Treatment focused on Aphasia;Patient/family/caregiver education   ? Skilled Treatment Skilled treatment session focused on improving pt's functional communication.  ? ?SLP facilitated session by providing the following interventions:     ? ?Customization of SGD:   Pt was Mod I with customization of SGD     ? ?Alternative forms of communication: Pt and her husband had questions regarding neurologist's suggest that they learn ASL to supplement verbal communication. Recommend making a list of functional words and watching YouTube to help with sign formation   ? ?  ?  ? ?  ? ? ? SLP Education - 02/07/22 1957   ? ? Education Details follow up treatment, ASL   ? Person(s) Educated Patient;Spouse   ? Methods Explanation;Demonstration   ? Comprehension Verbalized understanding;Returned demonstration   ? ?  ?  ? ?  ? ? ? SLP Short Term Goals - 01/25/22 0855   ? ?  ? SLP SHORT TERM GOAL #1  ? Title The patient will add new vocabulary to speech generating device at 80% accuracy given frequent moderate verbal and moderate visual cues in order to communicate  wants and needs.   ? Baseline new goal   ? Time 10   ? Period --   sessions  ? Status New   ?  ? SLP SHORT TERM GOAL #2  ? Title The patient will add new vocabulary to speech generating device at 80% accuracy given minimal verbal and minimal visual cues in order to communicate wants and needs.   ? Baseline goal achieved, goal revised   ? Time 10   ? Period --   session  ? Status Revised   ?  ? SLP SHORT TERM GOAL #3  ? Title Pt and her husband will create list of 10 functional phrases (scripts) to increase automaticity of daily communication.   ? Baseline ongoing, list of 5 phrases thus far   ? Time 10   ? Period --   sessions  ? Status On-going   ? ?  ?  ? ?  ? ? ? SLP Long Term Goals - 01/25/22 0858   ? ?  ? SLP LONG TERM GOAL #1  ? Title Pt will use multimodal communication to express her wants and needs as  well as converse with communication partner.   ? Baseline goal revised to include use of SGD   ? Time 12   ? Period Weeks   ? Status Revised   ? Target Date 04/19/22   ?  ? SLP LONG TERM GOAL #3  ? Status --   pt with progressive language deficits  ? ?  ?  ? ?  ? ? ? Plan - 02/07/22 1959   ? ? Clinical Impression Statement Pt presents with much improved ability to customize her SGD. As a result, will plan 1 additional session to answer any further questions that pt or her husband might have.   ? Speech Therapy Frequency 2x / week   ? Duration 12 weeks   ? Treatment/Interventions Language facilitation;Patient/family education;Compensatory strategies;SLP instruction and feedback   ? Potential to Achieve Goals Good   ? Potential Considerations Severity of impairments;Medical prognosis   ? SLP Home Exercise Plan provided, see pt instructions section   ? Consulted and Agree with Plan of Care Patient;Family member/caregiver   ? Family Member Consulted pt's husband   ? ?  ?  ? ?  ? ? ?Patient will benefit from skilled therapeutic intervention in order to improve the following deficits and impairments:   ?Aphasia ? ?Primary progressive aphasia ([place]) ? ? ? ?Problem List ?There are no problems to display for this patient. ? ?Darrnell Mangiaracina B. Rutherford Nail, M.S., CCC-SLP, CBIS ?Speech-Language Pathologist ?Rehabilitation Services ?Office [phone] ? ?[place], [place] ?02/07/2022, 8:01 PM ? ?[place] ?[facility] ?[place], [place], [address] ?Phone: [phone]   Fax:  [phone] ? ? ?Name: Carmen Gonzalez ?MRN: [id number] ?Date of Birth: 13-Aug-1951 ? ?

## 2022-02-10 ENCOUNTER — Encounter: Payer: Medicare HMO | Admitting: Speech Pathology

## 2022-02-14 ENCOUNTER — Ambulatory Visit: Payer: Medicare HMO | Admitting: Speech Pathology

## 2022-02-14 ENCOUNTER — Other Ambulatory Visit: Payer: Self-pay

## 2022-02-14 DIAGNOSIS — F0280 Dementia in other diseases classified elsewhere without behavioral disturbance: Secondary | ICD-10-CM

## 2022-02-14 DIAGNOSIS — R4701 Aphasia: Secondary | ICD-10-CM | POA: Diagnosis not present

## 2022-02-15 NOTE — Therapy (Signed)
[place] ?[facility] [facility] ?[place], [place], [address] ?Phone: [phone]   Fax:  [phone] ? ?Speech Language Pathology Treatment ? ?DISCHARGE SUMMARY ? ?Patient Details  ?Name: Carmen Gonzalez ?MRN: [id number] ?Date of Birth: 15-Jan-1951 ?Referring Provider (SLP): Dr Jennings Books ? ? ?Encounter Date: 02/14/2022 ? ? End of Session - 02/15/22 2013   ? ? Visit Number 27   ? Number of Visits 46   ? Date for SLP Re-Evaluation 04/19/22   ? Authorization Type Medicare   ? Authorization Time Period 01/24/2022 thru 04/19/2022   ? Authorization - Visit Number 7   ? Progress Note Due on Visit 10   ? SLP Start Time 1000   ? SLP Stop Time  1045   ? SLP Time Calculation (min) 45 min   ? Activity Tolerance Patient tolerated treatment well   ? ?  ?  ? ?  ? ? ?Past Medical History:  ?Diagnosis Date  ? Allergy   ? Arthritis   ? Asthma   ? Cataract cortical, senile   ? COPD (chronic obstructive pulmonary disease) ([place])   ? Deviated septum   ? Diabetes mellitus without complication ([place])   ? Glaucoma   ? Hypercholesteremia   ? Hyperlipidemia   ? Hypertension   ? Obesity   ? Tuberculosis   ? ? ?Past Surgical History:  ?Procedure Laterality Date  ? COLONOSCOPY WITH PROPOFOL N/A 01/17/2016  ? Procedure: COLONOSCOPY WITH PROPOFOL;  Surgeon: Manya Silvas, MD;  Location: [facility] ENDOSCOPY;  Service: Endoscopy;  Laterality: N/A;  ? COLONOSCOPY WITH PROPOFOL N/A 05/04/2021  ? Procedure: COLONOSCOPY WITH PROPOFOL;  Surgeon: Toledo, Benay Pike, MD;  Location: [facility] ENDOSCOPY;  Service: Gastroenterology;  Laterality: N/A;  ? CT ARTHROGRAM KNEE RT WO ([place] HX)    ? NASAL FRACTURE SURGERY    ? ? ?There were no vitals filed for this visit. ? ? Subjective Assessment - 02/15/22 2007   ? ? Subjective "I am doing good"   ? Patient is accompained by: Family member   ? Currently in Pain? No/denies   ? ?  ?  ? ?  ? ? ? ? ? ? ? ? ADULT SLP TREATMENT - 02/15/22 0001   ? ?  ? Treatment Provided  ?  Treatment provided Cognitive-Linquistic   ?  ? Cognitive-Linquistic Treatment  ? Treatment focused on Aphasia;Patient/family/caregiver education   ? Skilled Treatment Skilled treatment session focused on improving pt's functional communication. SLP facilitated session by providing the following interventions:    Customization of SGD:   Pt was Mod I with customization of SGD  Instructed pt in how to backup device  Downloaded and customized SmallTalk Aphasia Female on pt's cell phone for use in emergency   ? ?  ?  ? ?  ? ? ? SLP Education - 02/15/22 2013   ? ? Education Details completed   ? Person(s) Educated Patient;Spouse   ? Methods Explanation;Demonstration;Verbal cues   ? Comprehension Verbalized understanding;Returned demonstration   ? ?  ?  ? ?  ? ? ? SLP Short Term Goals - 02/15/22 2017   ? ?  ? SLP SHORT TERM GOAL #1  ? Title The patient will add new vocabulary to speech generating device at 80% accuracy given frequent moderate verbal and moderate visual cues in order to communicate wants and needs.   ? Status Achieved   ?  ? SLP SHORT TERM GOAL #2  ? Title The  patient will add new vocabulary to speech generating device at 80% accuracy given minimal verbal and minimal visual cues in order to communicate wants and needs.   ? Status Achieved   ?  ? SLP SHORT TERM GOAL #3  ? Title Pt and her husband will create list of 10 functional phrases (scripts) to increase automaticity of daily communication.   ? Status Achieved   ? ?  ?  ? ?  ? ? ? SLP Long Term Goals - 02/15/22 2017   ? ?  ? SLP LONG TERM GOAL #1  ? Title Pt will use multimodal communication to express her wants and needs as well as converse with communication partner.   ? Status Achieved   ? ?  ?  ? ?  ? ? ? Plan - 02/15/22 2014   ? ? Clinical Impression Statement Pt is independent with use of her SGD, all instruction and educaiton has been completed. At this time, skilled ST intervention is no longer required.   ? Consulted and Agree with Plan of Care  Patient;Family member/caregiver   ? Family Member Consulted pt's husband   ? ?  ?  ? ?  ? ? ?Patient will benefit from skilled therapeutic intervention in order to improve the following deficits and impairments:   ?Aphasia ? ?Primary progressive aphasia ([place]) ? ? ? ?Problem List ?There are no problems to display for this patient. ? ?Xeng Kucher B. Rutherford Nail, M.S., CCC-SLP, CBIS ?Speech-Language Pathologist ?Rehabilitation Services ?Office [phone] ? ?[place], [place] ?02/15/2022, 8:18 PM ? ?[place] [facility] ?[place], [place], [address] ?Phone: [phone]   Fax:  [phone] ? ? ?Name: Carmen Gonzalez ?MRN: [id number] ?Date of Birth: 11/15/1951 ? ?

## 2022-02-17 ENCOUNTER — Encounter: Payer: Medicare HMO | Admitting: Speech Pathology

## 2022-08-27 IMAGING — MG MM DIGITAL SCREENING BILAT W/ TOMO AND CAD
8 series · 8 of 24 positions shown · non-contrast
Comparison: Previous exam(s).

CLINICAL DATA: Screening.

EXAM:
DIGITAL SCREENING BILATERAL MAMMOGRAM WITH TOMOSYNTHESIS AND CAD
TECHNIQUE: Bilateral screening digital craniocaudal and mediolateral oblique
mammograms were obtained. Bilateral screening digital breast
tomosynthesis was performed. The images were evaluated with
computer-aided detection.

[L MLO synth-2D]
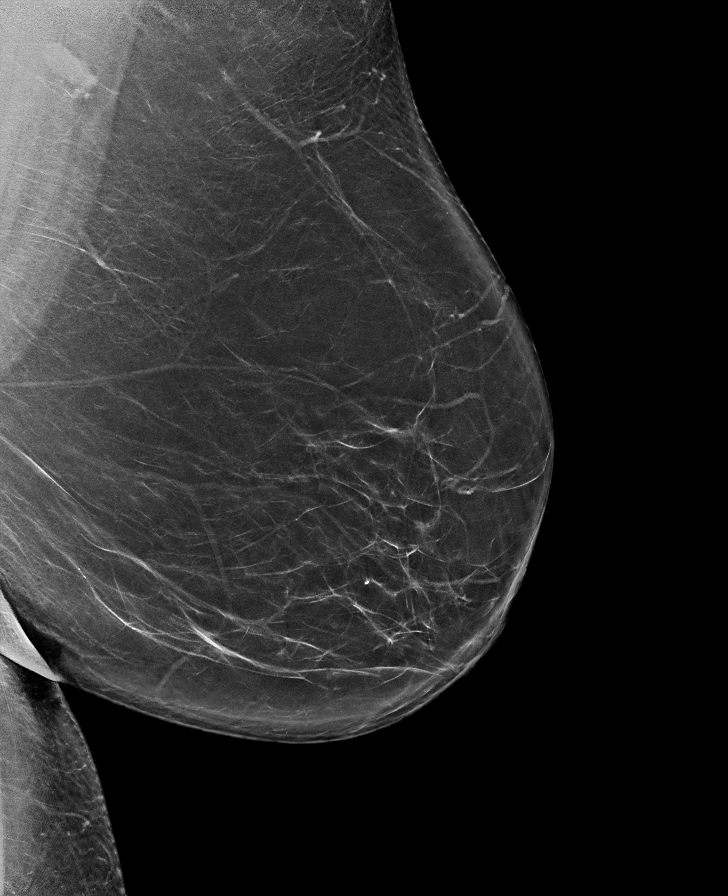

[L CC synth-2D]
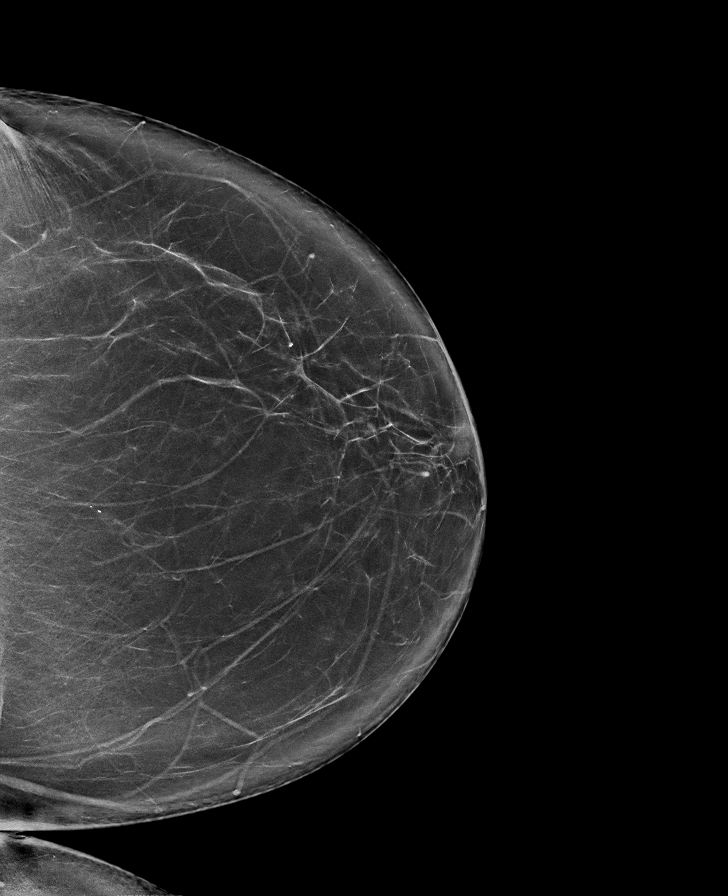

[R MLO synth-2D]
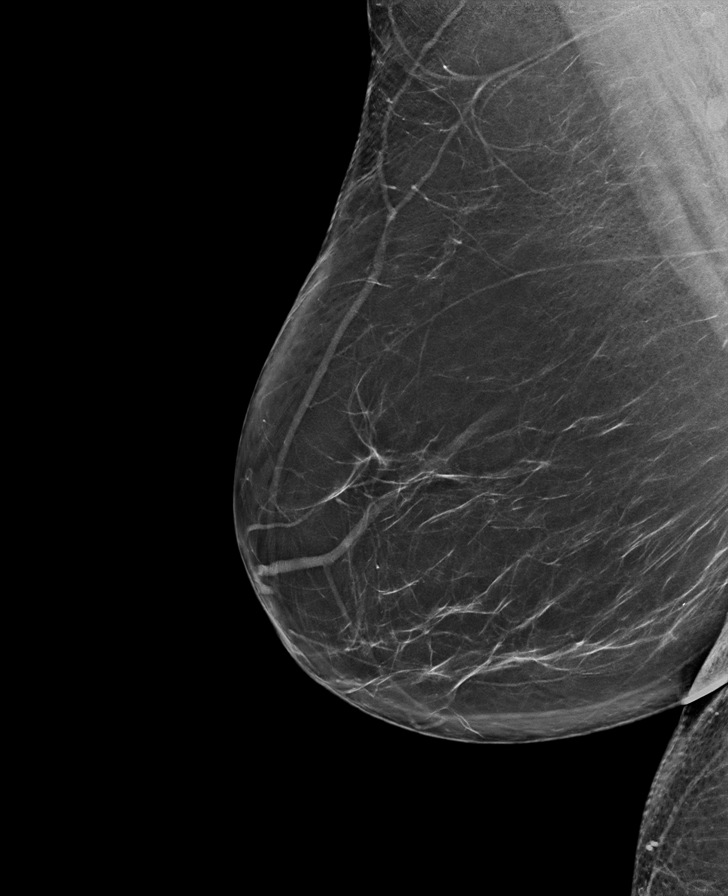

[R CC synth-2D]
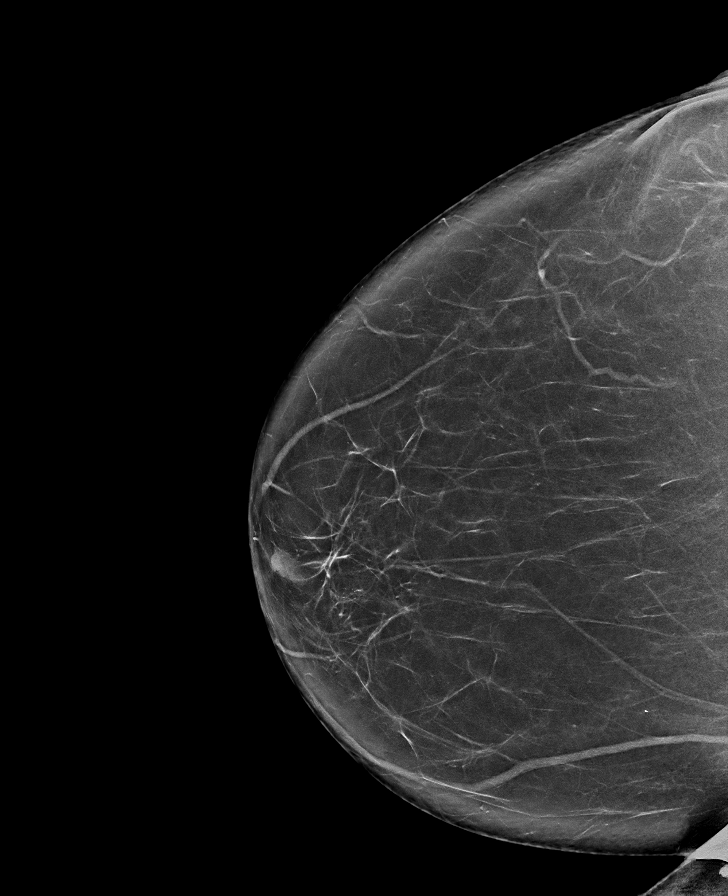

[L MLO tomo · tomo slice 45/90.0]
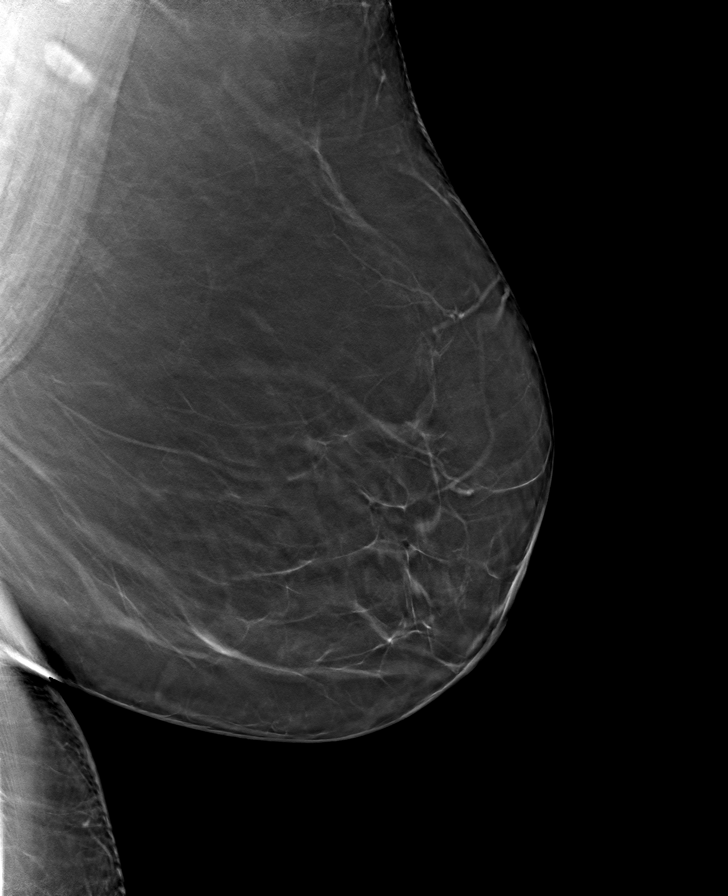

[R CC tomo · tomo slice 41/81.0]
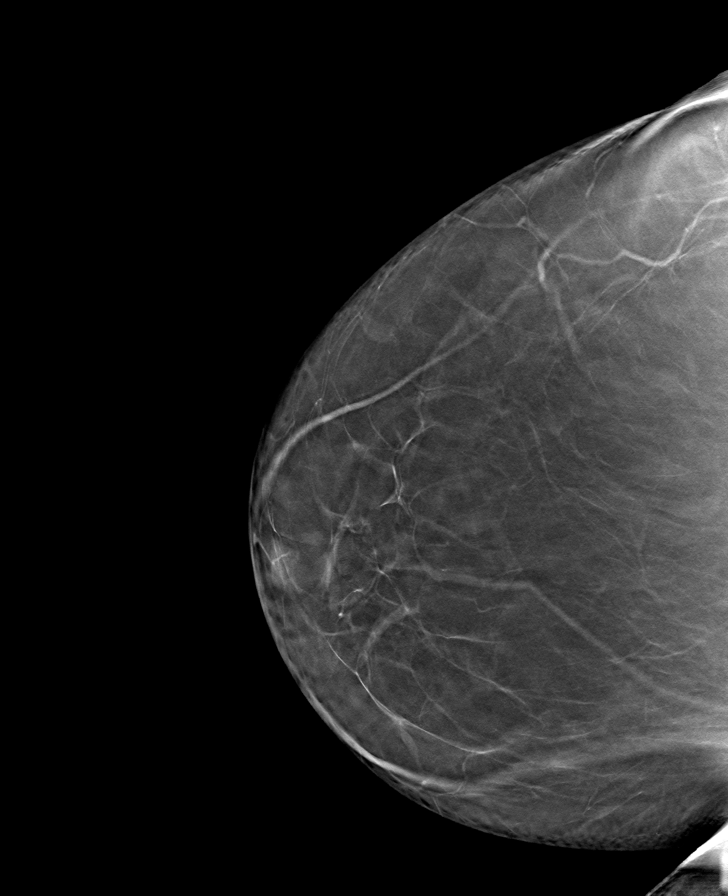

[R MLO tomo · tomo slice 43/84.0]
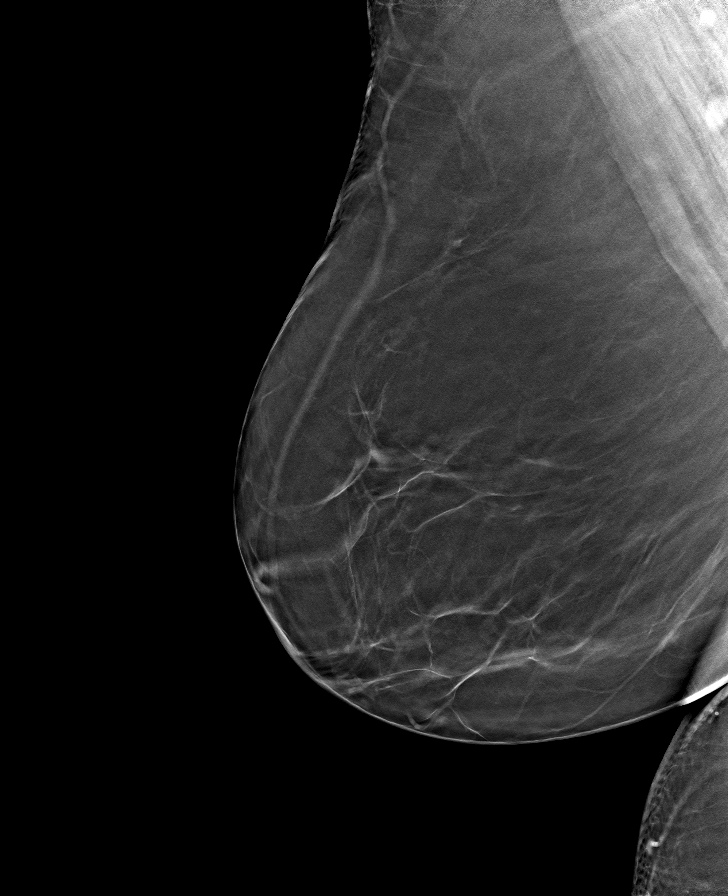

[L CC tomo · tomo slice 42/83.0]
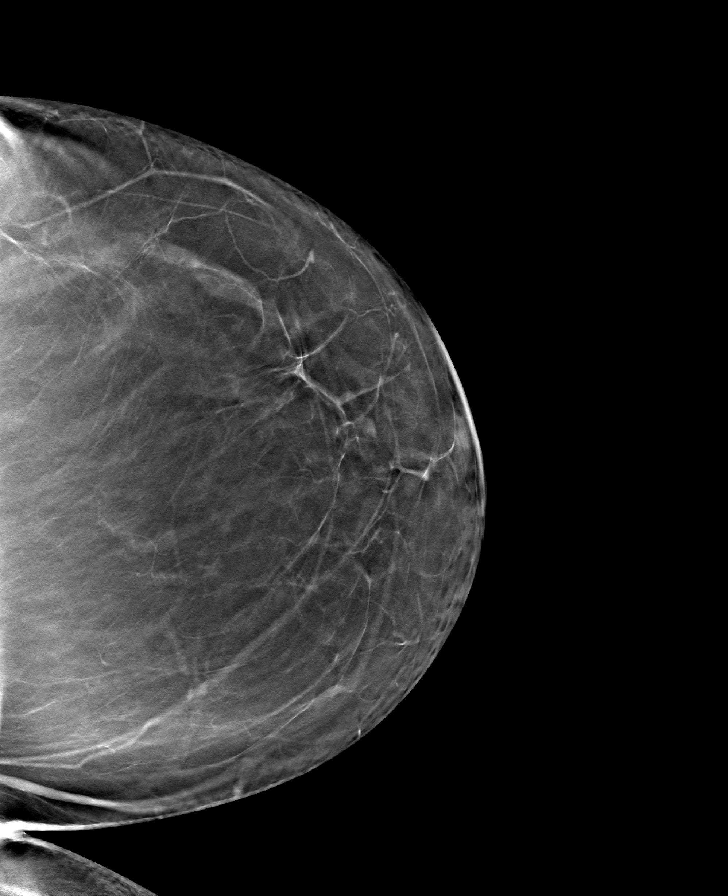

[8 of 24 positions shown; findings below may reference images not displayed]

ACR Breast Density Category b: There are scattered areas of
fibroglandular density.
FINDINGS: In the right breast, calcifications warrant further evaluation with
magnified views. In the left breast, no findings suspicious for
malignancy.
IMPRESSION: Further evaluation is suggested for calcifications in the right
breast.

RECOMMENDATION:
Diagnostic mammogram of the right breast. (Code:[id number]-[id number])

The patient will be contacted regarding the findings, and additional
imaging will be scheduled.

BI-RADS CATEGORY  0: Incomplete. Need additional imaging evaluation
and/or prior mammograms for comparison.

## 2022-11-29 ENCOUNTER — Other Ambulatory Visit: Payer: Self-pay | Admitting: Physician Assistant

## 2022-11-29 DIAGNOSIS — Z1231 Encounter for screening mammogram for malignant neoplasm of breast: Secondary | ICD-10-CM

## 2023-01-09 ENCOUNTER — Ambulatory Visit
Admission: RE | Admit: 2023-01-09 | Discharge: 2023-01-09 | Disposition: A | Discharge status: Home / Self Care | Payer: Medicare HMO | Source: Ambulatory Visit | Attending: Physician Assistant | Admitting: Physician Assistant

## 2023-01-09 DIAGNOSIS — Z1231 Encounter for screening mammogram for malignant neoplasm of breast: Secondary | ICD-10-CM

## 2023-12-04 ENCOUNTER — Other Ambulatory Visit: Payer: Self-pay | Admitting: Physician Assistant

## 2023-12-04 DIAGNOSIS — Z1231 Encounter for screening mammogram for malignant neoplasm of breast: Secondary | ICD-10-CM

## 2023-12-26 ENCOUNTER — Ambulatory Visit: Payer: Medicare HMO | Attending: Neurology | Admitting: Speech Pathology

## 2023-12-26 DIAGNOSIS — F0280 Dementia in other diseases classified elsewhere without behavioral disturbance: Secondary | ICD-10-CM | POA: Insufficient documentation

## 2023-12-26 DIAGNOSIS — R41841 Cognitive communication deficit: Secondary | ICD-10-CM | POA: Diagnosis present

## 2023-12-26 DIAGNOSIS — G3101 Pick's disease: Secondary | ICD-10-CM | POA: Diagnosis present

## 2023-12-26 DIAGNOSIS — R4701 Aphasia: Secondary | ICD-10-CM | POA: Insufficient documentation

## 2023-12-26 NOTE — Therapy (Signed)
 OUTPATIENT SPEECH LANGUAGE PATHOLOGY APHASIA EVALUATION   Patient Name: Carmen Gonzalez MRN: [id number] DOB:1951-01-13, 73 y.o., female Today's Date: 12/26/2023  PCP: Eva Crimes, MD REFERRING PROVIDER: Jannett Fairly, MD    End of Session - 12/26/23 0945     Visit Number 1    Number of Visits 17    Date for SLP Re-Evaluation 02/20/24    Authorization Type [employer] Medicare HMO/PPO    Progress Note Due on Visit 10    SLP Start Time 0945    SLP Stop Time  1030    SLP Time Calculation (min) 45 min    Activity Tolerance Patient tolerated treatment well             Past Medical History:  Diagnosis Date   Allergy    Arthritis    Asthma    Cataract cortical, senile    COPD (chronic obstructive pulmonary disease) (HCC)    Deviated septum    Diabetes mellitus without complication (HCC)    Glaucoma    Hypercholesteremia    Hyperlipidemia    Hypertension    Obesity    Tuberculosis    Past Surgical History:  Procedure Laterality Date   COLONOSCOPY WITH PROPOFOL  N/A 01/17/2016   Procedure: COLONOSCOPY WITH PROPOFOL ;  Surgeon: Lamar ONEIDA Holmes, MD;  Location: [facility] ENDOSCOPY;  Service: Endoscopy;  Laterality: N/A;   COLONOSCOPY WITH PROPOFOL  N/A 05/04/2021   Procedure: COLONOSCOPY WITH PROPOFOL ;  Surgeon: Toledo, Ladell POUR, MD;  Location: [facility] ENDOSCOPY;  Service: Gastroenterology;  Laterality: N/A;   CT ARTHROGRAM KNEE RT WO ([facility] HX)     NASAL FRACTURE SURGERY     There are no active problems to display for this patient.   ONSET DATE: 07/2018 onset of symptoms;  date of referral 12/10/2023  REFERRING DIAG: R41.89 (ICD-10-CM) - Cognitive change   THERAPY DIAG:  Aphasia  Primary progressive aphasia (HCC)  Rationale for Evaluation and Treatment Rehabilitation  SUBJECTIVE:   SUBJECTIVE STATEMENT: Pt known to this writer from previous course of skilled Outpatient ST services Pt accompanied by: significant other  PERTINENT HISTORY: Pt is a right handed 73 year  old with diagnosis of Primary Progressive Aphasia (nonfluent variant). Previous courses of Outpatient ST services 04/2019-08/2019 and 10/2021-01/2022. Pt also has an AAC from [employer].    DIAGNOSTIC FINDINGS:  12/17/2023 - MRI Parietal lob predominant parenchymal volume loss and probably chronic microvascular ischemic changes. No acute intracranial abnormality.    PAIN:  Are you having pain? No  FALLS: Has patient fallen in last 6 months?  No  LIVING ENVIRONMENT: Lives with: lives with their spouse Lives in: House/apartment  PLOF:  Level of assistance: Independent with ADLs, Independent with IADLs, Comment: decrease interest in participation Employment: Retired   PATIENT GOALS    pt's husband reports improve confidence and participation in tasks pt agreed with statement   OBJECTIVE:  COGNITION: Overall cognitive status: Impaired Areas of impairment:  Oriented to person Oriented to place Attention: Impaired: Sustained, Selective Memory: Impaired: Working Teacher, Music term Sport And Exercise Psychologist function: Impaired: Initiation, Impulse control, Problem solving, and Slow processing Behavior: Lability Functional deficits: Pt with decreased interest in or decreased task initiation  AUDITORY COMPREHENSION: Overall auditory comprehension: Impaired: simple YES/NO questions: Impaired: simple Following directions: Impaired: simple Conversation: Simple Interfering components: processing speed and working memory Effective technique: repetition/stressing words   READING COMPREHENSION: Impaired: word  EXPRESSION: verbal  VERBAL EXPRESSION: Overall verbal expression: Impaired: simple Level of generative/spontaneous verbalization: word and phrase Automatic speech: name: intact  Repetition: Impaired: Word Naming: Responsive: 26-50%, Confrontation: 26-50%, Convergent: 26-50%, and Divergent: 0-25% Pragmatics: Impaired: abnormal effect, dysprosody, eye contact,  interpretation of nonverbal communication, and monotone Effective technique: sentence completion Non-verbal means of communication: N/A  WRITTEN EXPRESSION: Dominant hand: right  Written expression: Impaired: word  MOTOR SPEECH: Overall motor speech: Appears intact Respiration: diaphragmatic/abdominal breathing Phonation: normal Resonance: WFL Articulation: Impaired: word Intelligibility: Intelligibility reduced Motor planning: Impaired: aware, groping for words, and inconsistent Motor speech errors: aware, groping for words, and inconsistent Interfering components:  word finding  Effective technique: slow rate   ORAL MOTOR EXAMINATION Facial : WFL Lingual: WFL Velum: WFL Mandible: WFL Cough: WFL Voice: WFL   STANDARDIZED ASSESSMENTS:    PATIENT REPORTED OUTCOME MEASURES (PROM): To be completed during the next 3 sessions   TODAY'S TREATMENT:  With maximal multimodal assistance, pt unable to decrease processing times or increase her attention to task.    PATIENT EDUCATION: Education details: results of this assessment and ST POC Person educated: Patient and Spouse Education method: Explanation Education comprehension: verbalized understanding  HOME EXERCISE PROGRAM:   N/A    GOALS:  Goals reviewed with patient? Yes  SHORT TERM GOALS: Target date: 10 sessions  With moderate assistance, pt will demonstrate selective attention to task for 10 minutes in a moderately distracting environment.  Baseline: Goal status: INITIAL  2.  Pt and her husband will assist in creating scripts of basic wants/needs to promote functional language of wants/needs.  Baseline:  Goal status: INITIAL   LONG TERM GOALS: Target date: 02/20/2024  With Moderate A, patient will use a circumlocution strategy, writing, drawing, and/or gesturing to describe target words with 75% accuracy to improve word-finding and reduce communication breakdowns, Baseline:  Goal status: INITIAL  2.   With Min A, patient/family will demonstrate understanding of the following concepts: primary progressive aphasia, communication vs conversation, strengths/strategies to promote success, local resources by answering multiple choice questions with 75% accuracy when provided supported conversation in order to increase patient's participation in medical care.  Baseline:  Goal status: INITIAL  3ASSESSMENT:  CLINICAL IMPRESSION: Patient is a 73 y.o. right handed female who was seen today for a cognitive communication evaluation. She presents with progressive cognitive deficits related to diagnosis of Primary Progressive Aphasia (nonfluent variant). Pt with noted decreased attention to task, increased response times, short term memory deficits impacting her ability to recall locations of icons on hs AAC, difficulty with problem solving basic tasks such as plugging in her phone and decreased task initiation/activity participation.   Expressive language abilities are c/b halting speech with increased distortions of phonemes suspicious for motor speech impairment In addition, her responses are delayed and consistent mostly of noun/verb (3-4 words). Receptive language deficits are moderate and appear to have worsened impacting her ability to understand conversation and yes/no questions.  Pt and her husband report that she is not longer able to read or spell.    Prognosis is guarded as pt with flat affect throughout session and replied no when asked if she would do activities created by SLP during the day.   OBJECTIVE IMPAIRMENTS include memory, expressive language, receptive language, and apraxia. These impairments are limiting patient from managing medications, managing appointments, managing finances, household responsibilities, ADLs/IADLs, and effectively communicating at home and in community. Factors affecting potential to achieve goals and functional outcome are ability to learn/carryover information,  co-morbidities, medical prognosis, previous level of function, and severity of impairments. Patient will benefit from skilled SLP services to address above impairments and  improve overall function.  REHAB POTENTIAL: Fair nuerodegenerative disorder  PLAN: SLP FREQUENCY: 1-2x/week  SLP DURATION: 8 weeks  PLANNED INTERVENTIONS: Language facilitation, Environmental controls, Cueing hierachy, Internal/external aids, Functional tasks, Multimodal communication approach, SLP instruction and feedback, Compensatory strategies, and Patient/family education    Asja Frommer B. Rubbie, M.S., CCC-SLP, Tree Surgeon Certified Brain Injury Specialist [facility]  [facility] Rehabilitation Services Office [phone] Ascom [phone] Fax [phone]

## 2023-12-28 ENCOUNTER — Ambulatory Visit: Payer: Medicare HMO | Admitting: Speech Pathology

## 2023-12-28 DIAGNOSIS — R41841 Cognitive communication deficit: Secondary | ICD-10-CM

## 2023-12-28 DIAGNOSIS — F0280 Dementia in other diseases classified elsewhere without behavioral disturbance: Secondary | ICD-10-CM

## 2023-12-28 DIAGNOSIS — R4701 Aphasia: Secondary | ICD-10-CM

## 2023-12-28 NOTE — Therapy (Signed)
 OUTPATIENT SPEECH LANGUAGE PATHOLOGY TREATMENT NOTE  Patient Name: Carmen Gonzalez MRN: [id number] DOB:1951/09/12, 73 y.o., female Today's Date: 12/28/2023  PCP: Eva Crimes, MD REFERRING PROVIDER: Jannett Fairly, MD    End of Session - 12/28/23 [phone]     Visit Number 2    Number of Visits 17    Date for SLP Re-Evaluation 02/20/24    Authorization Type [employer] Medicare HMO/PPO    Progress Note Due on Visit 10    SLP Start Time 0930    SLP Stop Time  1000    SLP Time Calculation (min) 30 min    Activity Tolerance Patient tolerated treatment well             Past Medical History:  Diagnosis Date   Allergy    Arthritis    Asthma    Cataract cortical, senile    COPD (chronic obstructive pulmonary disease) (HCC)    Deviated septum    Diabetes mellitus without complication (HCC)    Glaucoma    Hypercholesteremia    Hyperlipidemia    Hypertension    Obesity    Tuberculosis    Past Surgical History:  Procedure Laterality Date   COLONOSCOPY WITH PROPOFOL  N/A 01/17/2016   Procedure: COLONOSCOPY WITH PROPOFOL ;  Surgeon: Lamar ONEIDA Holmes, MD;  Location: [facility] ENDOSCOPY;  Service: Endoscopy;  Laterality: N/A;   COLONOSCOPY WITH PROPOFOL  N/A 05/04/2021   Procedure: COLONOSCOPY WITH PROPOFOL ;  Surgeon: Toledo, Ladell POUR, MD;  Location: [facility] ENDOSCOPY;  Service: Gastroenterology;  Laterality: N/A;   CT ARTHROGRAM KNEE RT WO ([facility] HX)     NASAL FRACTURE SURGERY     There are no active problems to display for this patient.   ONSET DATE: 07/2018 onset of symptoms;  date of referral 12/10/2023  REFERRING DIAG: R41.89 (ICD-10-CM) - Cognitive change   THERAPY DIAG:  Aphasia  Cognitive communication deficit  Primary progressive aphasia (HCC)  Rationale for Evaluation and Treatment Rehabilitation  SUBJECTIVE:   PERTINENT HISTORY: Pt is a right handed 73 year old with diagnosis of Primary Progressive Aphasia (nonfluent variant). Previous courses of Outpatient ST services  04/2019-08/2019 and 10/2021-01/2022. Pt also has an AAC from [employer].    DIAGNOSTIC FINDINGS:  12/17/2023 - MRI Parietal lob predominant parenchymal volume loss and probably chronic microvascular ischemic changes. No acute intracranial abnormality.    PAIN:  Are you having pain? No  FALLS: Has patient fallen in last 6 months?  No  LIVING ENVIRONMENT: Lives with: lives with their spouse Lives in: House/apartment  PLOF:  Level of assistance: Independent with ADLs, Independent with IADLs, Comment: decrease interest in participation Employment: Retired   PATIENT GOALS    pt's husband reports improve confidence and participation in tasks pt agreed with statement   SUBJECTIVE STATEMENT: Pt eager, pleasant Pt accompanied by: significant other  OBJECTIVE:  TODAY'S TREATMENT:   Skilled treatment session focused on pt's cognitive linguistic communication goals. SLP facilitated session by providing the following interventions:  SLP provided skilled verbal and written information on recommendations to improve brain health in addition to improving language function: Increase participation in household tasks, exercise and education also provided on PPA within scope of dementia as a language impairment impacting all modalities of language use as well as characteristics of dementia impacting cognition such as memory, attention to task and desire to complete tasks.   PT noted with increased affect during today's tasks.   PATIENT EDUCATION: Education details: results of this assessment and ST POC Person educated: Patient and Spouse  Education method: Explanation Education comprehension: verbalized understanding  HOME EXERCISE PROGRAM:   N/A    GOALS:  Goals reviewed with patient? Yes  SHORT TERM GOALS: Target date: 10 sessions  With moderate assistance, pt will demonstrate selective attention to task for 10 minutes in a moderately distracting environment.  Baseline: Goal  status: INITIAL  2.  Pt and her husband will assist in creating scripts of basic wants/needs to promote functional language of wants/needs.  Baseline:  Goal status: INITIAL   LONG TERM GOALS: Target date: 02/20/2024  With Moderate A, patient will use a circumlocution strategy, writing, drawing, and/or gesturing to describe target words with 75% accuracy to improve word-finding and reduce communication breakdowns, Baseline:  Goal status: INITIAL  2.  With Min A, patient/family will demonstrate understanding of the following concepts: primary progressive aphasia, communication vs conversation, strengths/strategies to promote success, local resources by answering multiple choice questions with 75% accuracy when provided supported conversation in order to increase patient's participation in medical care.  Baseline:  Goal status: INITIAL  3ASSESSMENT:  CLINICAL IMPRESSION: Patient is a 73 y.o. right handed female who was seen today for a cognitive communication evaluation. She presents with progressive cognitive deficits related to diagnosis of Primary Progressive Aphasia (nonfluent variant). Pt with noted decreased attention to task, increased response times, short term memory deficits impacting her ability to recall locations of icons on hs AAC, difficulty with problem solving basic tasks such as plugging in her phone and decreased task initiation/activity participation.   Expressive language abilities are c/b halting speech with increased distortions of phonemes suspicious for motor speech impairment In addition, her responses are delayed and consistent mostly of noun/verb (3-4 words). Receptive language deficits are moderate and appear to have worsened impacting her ability to understand conversation and yes/no questions.  Pt and her husband report that she is not longer able to read or spell.    Prognosis is guarded as pt with flat affect throughout session and replied no when asked if she  would do activities created by SLP during the day.   Pt and her husband were eager to increase understanding of all education during today's session.   OBJECTIVE IMPAIRMENTS include memory, expressive language, receptive language, and apraxia. These impairments are limiting patient from managing medications, managing appointments, managing finances, household responsibilities, ADLs/IADLs, and effectively communicating at home and in community. Factors affecting potential to achieve goals and functional outcome are ability to learn/carryover information, co-morbidities, medical prognosis, previous level of function, and severity of impairments. Patient will benefit from skilled SLP services to address above impairments and improve overall function.  REHAB POTENTIAL: Fair nuerodegenerative disorder  PLAN: SLP FREQUENCY: 1-2x/week  SLP DURATION: 8 weeks  PLANNED INTERVENTIONS: Language facilitation, Environmental controls, Cueing hierachy, Internal/external aids, Functional tasks, Multimodal communication approach, SLP instruction and feedback, Compensatory strategies, and Patient/family education    Elizer Bostic B. Rubbie, M.S., CCC-SLP, Tree Surgeon Certified Brain Injury Specialist [facility]  [facility] Rehabilitation Services Office [phone] Ascom [phone] Fax [phone]

## 2024-01-02 ENCOUNTER — Ambulatory Visit: Payer: Medicare HMO | Admitting: Speech Pathology

## 2024-01-02 DIAGNOSIS — R4701 Aphasia: Secondary | ICD-10-CM | POA: Diagnosis not present

## 2024-01-02 DIAGNOSIS — F0280 Dementia in other diseases classified elsewhere without behavioral disturbance: Secondary | ICD-10-CM

## 2024-01-02 DIAGNOSIS — R41841 Cognitive communication deficit: Secondary | ICD-10-CM

## 2024-01-02 NOTE — Therapy (Signed)
OUTPATIENT SPEECH LANGUAGE PATHOLOGY TREATMENT NOTE  Patient Name: Carmen Gonzalez MRN: [id number] DOB:27-Feb-1951, 73 y.o., female Today's Date: 01/02/2024  PCP: Maurine Minister, MD REFERRING PROVIDER: Cristopher Peru, MD    End of Session - 01/02/24 1135     Visit Number 3    Number of Visits 17    Date for SLP Re-Evaluation 02/20/24    Authorization Type [employer] Medicare HMO/PPO    Progress Note Due on Visit 10    SLP Start Time 1100    SLP Stop Time  1135    SLP Time Calculation (min) 35 min    Activity Tolerance Patient tolerated treatment well             Past Medical History:  Diagnosis Date   Allergy    Arthritis    Asthma    Cataract cortical, senile    COPD (chronic obstructive pulmonary disease) (HCC)    Deviated septum    Diabetes mellitus without complication (HCC)    Glaucoma    Hypercholesteremia    Hyperlipidemia    Hypertension    Obesity    Tuberculosis    Past Surgical History:  Procedure Laterality Date   COLONOSCOPY WITH PROPOFOL N/A 01/17/2016   Procedure: COLONOSCOPY WITH PROPOFOL;  Surgeon: Scot Jun, MD;  Location: [facility] ENDOSCOPY;  Service: Endoscopy;  Laterality: N/A;   COLONOSCOPY WITH PROPOFOL N/A 05/04/2021   Procedure: COLONOSCOPY WITH PROPOFOL;  Surgeon: Toledo, Boykin Nearing, MD;  Location: [facility] ENDOSCOPY;  Service: Gastroenterology;  Laterality: N/A;   CT ARTHROGRAM KNEE RT WO ([facility] HX)     NASAL FRACTURE SURGERY     There are no active problems to display for this patient.   ONSET DATE: 07/2018 onset of symptoms;  date of referral 12/10/2023  REFERRING DIAG: R41.89 (ICD-10-CM) - Cognitive change   THERAPY DIAG:  Aphasia  Cognitive communication deficit  Primary progressive aphasia (HCC)  Rationale for Evaluation and Treatment Rehabilitation  SUBJECTIVE:   PERTINENT HISTORY: Carmen Gonzalez is a right handed 73 year old with diagnosis of Primary Progressive Aphasia (nonfluent variant). Previous courses of Outpatient ST services  04/2019-08/2019 and 10/2021-01/2022. Carmen Gonzalez also has an AAC from [place].    DIAGNOSTIC FINDINGS:  12/17/2023 - MRI Parietal lob predominant parenchymal volume loss and probably chronic microvascular ischemic changes. No acute intracranial abnormality.    PAIN:  Are you having pain? No  FALLS: Has patient fallen in last 6 months?  No  LIVING ENVIRONMENT: Lives with: lives with their spouse Lives in: House/apartment  PLOF:  Level of assistance: Independent with ADLs, Independent with IADLs, Comment: decrease interest in participation Employment: Retired   PATIENT GOALS    Carmen Gonzalez's husband reports "improve confidence and participation in tasks" Carmen Gonzalez agreed with statement   SUBJECTIVE STATEMENT: Carmen Gonzalez pleasant, "messy" referring to messy weather day Carmen Gonzalez accompanied by: significant other  OBJECTIVE:  TODAY'S TREATMENT:   Skilled treatment session focused on Carmen Gonzalez's cognitive linguistic communication goals. SLP facilitated session by providing the following interventions:  Carmen Gonzalez continues with increased affect and interaction during today's session. Continued skilled verbal education provided on ways to involve Carmen Gonzalez in ADLs/iADLs, safe cooking as well as education provided on potential for fluctuating abilities. Carmen Gonzalez and husband instructed to continue to attempt activities with supervision such as making Carmen Gonzalez's cookies. Both voiced understanding of ways to involve Carmen Gonzalez safely.   PATIENT EDUCATION: Education details: see above Person educated: Patient and Spouse Education method: Explanation Education comprehension: verbalized understanding  HOME EXERCISE PROGRAM:  Increase participation in  daily activities    GOALS:  Goals reviewed with patient? Yes  SHORT TERM GOALS: Target date: 10 sessions  With moderate assistance, Carmen Gonzalez will demonstrate selective attention to task for 10 minutes in a moderately distracting environment.  Baseline: Goal status: INITIAL  2.  Carmen Gonzalez and her husband will assist in  creating scripts of basic wants/needs to promote functional language of wants/needs.  Baseline:  Goal status: INITIAL   LONG TERM GOALS: Target date: 02/20/2024  With Moderate A, patient will use a circumlocution strategy, writing, drawing, and/or gesturing to describe target words with 75% accuracy to improve word-finding and reduce communication breakdowns, Baseline:  Goal status: INITIAL  2.  With Min A, patient/family will demonstrate understanding of the following concepts: primary progressive aphasia, communication vs conversation, strengths/strategies to promote success, local resources by answering multiple choice questions with 75% accuracy when provided supported conversation in order to increase patient's participation in medical care.  Baseline:  Goal status: INITIAL  ASSESSMENT:  CLINICAL IMPRESSION: Patient is a 73 y.o. right handed female who was seen today for a cognitive communication treatment. She presents with progressive cognitive deficits related to diagnosis of Primary Progressive Aphasia (nonfluent variant). Carmen Gonzalez with noted decreased attention to task, increased response times, short term memory deficits impacting her ability to recall locations of icons on hs AAC, difficulty with problem solving basic tasks such as plugging in her phone and decreased task initiation/activity participation.   Expressive language abilities are c/b halting speech with increased distortions of phonemes suspicious for motor speech impairment In addition, her responses are delayed and consistent mostly of noun/verb (3-4 words). Receptive language deficits are moderate and appear to have worsened impacting her ability to understand conversation and yes/no questions.  Carmen Gonzalez and her husband report that she is not longer able to read or spell.    Prognosis is guarded as Carmen Gonzalez with flat affect throughout session and replied "no" when asked if she would do activities created by SLP during the day.   Carmen Gonzalez  and her husband continue to improve in their implementation of recommendations.   OBJECTIVE IMPAIRMENTS include memory, expressive language, receptive language, and apraxia. These impairments are limiting patient from managing medications, managing appointments, managing finances, household responsibilities, ADLs/IADLs, and effectively communicating at home and in community. Factors affecting potential to achieve goals and functional outcome are ability to learn/carryover information, co-morbidities, medical prognosis, previous level of function, and severity of impairments. Patient will benefit from skilled SLP services to address above impairments and improve overall function.  REHAB POTENTIAL: Fair nuerodegenerative disorder  PLAN: SLP FREQUENCY: 1-2x/week  SLP DURATION: 8 weeks  PLANNED INTERVENTIONS: Language facilitation, Environmental controls, Cueing hierachy, Internal/external aids, Functional tasks, Multimodal communication approach, SLP instruction and feedback, Compensatory strategies, and Patient/family education    Hajer Dwyer B. Dreama Saa, M.S., CCC-SLP, Tree surgeon Certified Brain Injury Specialist [facility]  [facility] Rehabilitation Services Office [phone] Ascom [phone] Fax [phone]

## 2024-01-04 ENCOUNTER — Ambulatory Visit: Payer: Medicare HMO | Admitting: Speech Pathology

## 2024-01-08 ENCOUNTER — Ambulatory Visit: Payer: Medicare HMO | Admitting: Speech Pathology

## 2024-01-09 ENCOUNTER — Ambulatory Visit: Payer: Medicare HMO | Admitting: Speech Pathology

## 2024-01-10 ENCOUNTER — Ambulatory Visit: Payer: Medicare HMO | Admitting: Speech Pathology

## 2024-01-11 ENCOUNTER — Ambulatory Visit
Admission: RE | Admit: 2024-01-11 | Discharge: 2024-01-11 | Disposition: A | Discharge status: Home / Self Care | Payer: Medicare HMO | Source: Ambulatory Visit | Attending: Physician Assistant | Admitting: Physician Assistant

## 2024-01-11 DIAGNOSIS — Z1231 Encounter for screening mammogram for malignant neoplasm of breast: Secondary | ICD-10-CM | POA: Insufficient documentation

## 2024-01-16 ENCOUNTER — Ambulatory Visit: Payer: Medicare HMO | Admitting: Speech Pathology

## 2024-01-16 DIAGNOSIS — R4701 Aphasia: Secondary | ICD-10-CM | POA: Diagnosis not present

## 2024-01-16 DIAGNOSIS — R41841 Cognitive communication deficit: Secondary | ICD-10-CM

## 2024-01-16 DIAGNOSIS — F0280 Dementia in other diseases classified elsewhere without behavioral disturbance: Secondary | ICD-10-CM

## 2024-01-16 NOTE — Therapy (Signed)
 OUTPATIENT SPEECH LANGUAGE PATHOLOGY TREATMENT NOTE  Patient Name: Carmen Gonzalez MRN: [id number] DOB:1951-02-17, 73 y.o., female Today's Date: 01/16/2024  PCP: Maurine Minister, MD REFERRING PROVIDER: Cristopher Peru, MD    End of Session - 01/16/24 1101     Visit Number 4    Number of Visits 17    Date for SLP Re-Evaluation 02/20/24    Authorization Type [employer] HMO/PPO    Progress Note Due on Visit 10    SLP Start Time 1100    SLP Stop Time  1145    SLP Time Calculation (min) 45 min    Activity Tolerance Patient tolerated treatment well             Past Medical History:  Diagnosis Date   Allergy    Arthritis    Asthma    Cataract cortical, senile    COPD (chronic obstructive pulmonary disease) (HCC)    Deviated septum    Diabetes mellitus without complication (HCC)    Glaucoma    Hypercholesteremia    Hyperlipidemia    Hypertension    Obesity    Tuberculosis    Past Surgical History:  Procedure Laterality Date   COLONOSCOPY WITH PROPOFOL N/A 01/17/2016   Procedure: COLONOSCOPY WITH PROPOFOL;  Surgeon: Scot Jun, MD;  Location: [facility] ENDOSCOPY;  Service: Endoscopy;  Laterality: N/A;   COLONOSCOPY WITH PROPOFOL N/A 05/04/2021   Procedure: COLONOSCOPY WITH PROPOFOL;  Surgeon: Toledo, Boykin Nearing, MD;  Location: [facility] ENDOSCOPY;  Service: Gastroenterology;  Laterality: N/A;   CT ARTHROGRAM KNEE RT WO ([facility] HX)     NASAL FRACTURE SURGERY     There are no active problems to display for this patient.   ONSET DATE: 07/2018 onset of symptoms;  date of referral 12/10/2023  REFERRING DIAG: R41.89 (ICD-10-CM) - Cognitive change   THERAPY DIAG:  Aphasia  Cognitive communication deficit  Primary progressive aphasia (HCC)  Rationale for Evaluation and Treatment Rehabilitation  SUBJECTIVE:   PERTINENT HISTORY: Pt is a right handed 73 year old with diagnosis of Primary Progressive Aphasia (nonfluent variant). Previous courses of Outpatient ST services  04/2019-08/2019 and 10/2021-01/2022. Pt also has an AAC from [place].    DIAGNOSTIC FINDINGS:  12/17/2023 - MRI Parietal lob predominant parenchymal volume loss and probably chronic microvascular ischemic changes. No acute intracranial abnormality.    PAIN:  Are you having pain? No  FALLS: Has patient fallen in last 6 months?  No  LIVING ENVIRONMENT: Lives with: lives with their spouse Lives in: House/apartment  PLOF:  Level of assistance: Independent with ADLs, Independent with IADLs, Comment: decrease interest in participation Employment: Retired   PATIENT GOALS    pt's husband reports "improve confidence and participation in tasks" pt agreed with statement   SUBJECTIVE STATEMENT: Pt pleasant, had missed several sessions d/t snow - pt replied "snow day" when referring to those days Pt accompanied by: significant other  OBJECTIVE:  TODAY'S TREATMENT:   Skilled treatment session focused on pt's cognitive linguistic communication goals. SLP facilitated session by providing the following interventions:  Pt continues with increased affect and interaction during today's session. Continued skilled verbal education provided on ways to involve pt in ADLs/iADLs, safe cooking as well as education provided on potential for fluctuating abilities. Pt and her husband instructed to look at photos to promote increased language use of naming people, places and events.   PATIENT EDUCATION: Education details: see above Person educated: Patient and Spouse Education method: Explanation Education comprehension: verbalized understanding  HOME EXERCISE  PROGRAM:  Increase participation in daily activities    GOALS:  Goals reviewed with patient? Yes  SHORT TERM GOALS: Target date: 10 sessions  With moderate assistance, pt will demonstrate selective attention to task for 10 minutes in a moderately distracting environment.  Baseline: Goal status: INITIAL  2.  Pt and her husband will  assist in creating scripts of basic wants/needs to promote functional language of wants/needs.  Baseline:  Goal status: INITIAL   LONG TERM GOALS: Target date: 02/20/2024  With Moderate A, patient will use a circumlocution strategy, writing, drawing, and/or gesturing to describe target words with 75% accuracy to improve word-finding and reduce communication breakdowns, Baseline:  Goal status: INITIAL  2.  With Min A, patient/family will demonstrate understanding of the following concepts: primary progressive aphasia, communication vs conversation, strengths/strategies to promote success, local resources by answering multiple choice questions with 75% accuracy when provided supported conversation in order to increase patient's participation in medical care.  Baseline:  Goal status: INITIAL  ASSESSMENT:  CLINICAL IMPRESSION: Patient is a 73 y.o. right handed female who was seen today for a cognitive communication treatment. She presents with progressive cognitive deficits related to diagnosis of Primary Progressive Aphasia (nonfluent variant). Pt with noted decreased attention to task, increased response times, short term memory deficits impacting her ability to recall locations of icons on hs AAC, difficulty with problem solving basic tasks such as plugging in her phone and decreased task initiation/activity participation.   Expressive language abilities are c/b halting speech with increased distortions of phonemes suspicious for motor speech impairment In addition, her responses are delayed and consistent mostly of noun/verb (3-4 words). Receptive language deficits are moderate and appear to have worsened impacting her ability to understand conversation and yes/no questions.  Pt and her husband report that she is not longer able to read or spell.    Pt with the support of her husband continue to implement strategies/recommendations provided within the session. See the above treatment note for  details.   OBJECTIVE IMPAIRMENTS include memory, expressive language, receptive language, and apraxia. These impairments are limiting patient from managing medications, managing appointments, managing finances, household responsibilities, ADLs/IADLs, and effectively communicating at home and in community. Factors affecting potential to achieve goals and functional outcome are ability to learn/carryover information, co-morbidities, medical prognosis, previous level of function, and severity of impairments. Patient will benefit from skilled SLP services to address above impairments and improve overall function.  REHAB POTENTIAL: Fair nuerodegenerative disorder  PLAN: SLP FREQUENCY: 1-2x/week  SLP DURATION: 8 weeks  PLANNED INTERVENTIONS: Language facilitation, Environmental controls, Cueing hierachy, Internal/external aids, Functional tasks, Multimodal communication approach, SLP instruction and feedback, Compensatory strategies, and Patient/family education    Jamarien Rodkey B. Dreama Saa, M.S., CCC-SLP, Tree surgeon Certified Brain Injury Specialist [facility]  [facility] Rehabilitation Services Office [phone] Ascom [phone] Fax [phone]

## 2024-01-18 ENCOUNTER — Ambulatory Visit: Payer: Medicare HMO | Admitting: Speech Pathology

## 2024-01-21 ENCOUNTER — Telehealth: Payer: Self-pay | Admitting: Speech Pathology

## 2024-01-21 ENCOUNTER — Ambulatory Visit: Payer: Medicare HMO | Attending: Neurology | Admitting: Speech Pathology

## 2024-01-21 DIAGNOSIS — R4701 Aphasia: Secondary | ICD-10-CM | POA: Insufficient documentation

## 2024-01-21 DIAGNOSIS — G3101 Pick's disease: Secondary | ICD-10-CM | POA: Insufficient documentation

## 2024-01-21 DIAGNOSIS — R41841 Cognitive communication deficit: Secondary | ICD-10-CM | POA: Insufficient documentation

## 2024-01-21 DIAGNOSIS — F0280 Dementia in other diseases classified elsewhere without behavioral disturbance: Secondary | ICD-10-CM | POA: Insufficient documentation

## 2024-01-21 NOTE — Telephone Encounter (Signed)
 Pt was a no call no show.   Called and spoke with pt. Reminder provided of next session on Wednesday March 5th at 11am.   Benjamyn Hestand B. Dreama Saa, M.S., CCC-SLP, Tree surgeon Certified Brain Injury Specialist [facility]  [facility] Rehabilitation Services Office [phone] Ascom [phone] Fax [phone]

## 2024-01-23 ENCOUNTER — Ambulatory Visit: Payer: Medicare HMO | Admitting: Speech Pathology

## 2024-01-23 DIAGNOSIS — F0280 Dementia in other diseases classified elsewhere without behavioral disturbance: Secondary | ICD-10-CM | POA: Diagnosis present

## 2024-01-23 DIAGNOSIS — R41841 Cognitive communication deficit: Secondary | ICD-10-CM

## 2024-01-23 DIAGNOSIS — R4701 Aphasia: Secondary | ICD-10-CM

## 2024-01-23 DIAGNOSIS — G3101 Pick's disease: Secondary | ICD-10-CM | POA: Diagnosis present

## 2024-01-23 NOTE — Therapy (Signed)
 OUTPATIENT SPEECH LANGUAGE PATHOLOGY TREATMENT NOTE  Patient Name: Carmen Gonzalez MRN: [id number] DOB:[birth year], 73 y.o., female Today's Date: 01/23/2024  PCP: Maurine Minister, MD REFERRING PROVIDER: Cristopher Peru, MD    End of Session - 01/23/24 1057     Visit Number 5    Number of Visits 17    Date for SLP Re-Evaluation 02/20/24    Authorization Type [employer] Medicare HMO/PPO    Progress Note Due on Visit 10    SLP Start Time 1100    SLP Stop Time  1145    SLP Time Calculation (min) 45 min    Activity Tolerance Patient tolerated treatment well             Past Medical History:  Diagnosis Date   Allergy    Arthritis    Asthma    Cataract cortical, senile    COPD (chronic obstructive pulmonary disease) (HCC)    Deviated septum    Diabetes mellitus without complication (HCC)    Glaucoma    Hypercholesteremia    Hyperlipidemia    Hypertension    Obesity    Tuberculosis    Past Surgical History:  Procedure Laterality Date   COLONOSCOPY WITH PROPOFOL N/A 01/17/2016   Procedure: COLONOSCOPY WITH PROPOFOL;  Surgeon: Scot Jun, MD;  Location: [facility] ENDOSCOPY;  Service: Endoscopy;  Laterality: N/A;   COLONOSCOPY WITH PROPOFOL N/A 05/04/2021   Procedure: COLONOSCOPY WITH PROPOFOL;  Surgeon: Toledo, Boykin Nearing, MD;  Location: [facility] ENDOSCOPY;  Service: Gastroenterology;  Laterality: N/A;   CT ARTHROGRAM KNEE RT WO ([facility] HX)     NASAL FRACTURE SURGERY     There are no active problems to display for this patient.   ONSET DATE: 07/2018 onset of symptoms;  date of referral 12/10/2023  REFERRING DIAG: R41.89 (ICD-10-CM) - Cognitive change   THERAPY DIAG:  Aphasia  Cognitive communication deficit  Rationale for Evaluation and Treatment Rehabilitation  SUBJECTIVE:   PERTINENT HISTORY: Pt is a right handed 73 year old with diagnosis of Primary Progressive Aphasia (nonfluent variant). Previous courses of Outpatient ST services 04/2019-08/2019 and 10/2021-01/2022. Pt  also has an AAC from [place].    DIAGNOSTIC FINDINGS:  12/17/2023 - MRI Parietal lob predominant parenchymal volume loss and probably chronic microvascular ischemic changes. No acute intracranial abnormality.    PAIN:  Are you having pain? No  FALLS: Has patient fallen in last 6 months?  No  LIVING ENVIRONMENT: Lives with: lives with their spouse Lives in: House/apartment  PLOF:  Level of assistance: Independent with ADLs, Independent with IADLs, Comment: decrease interest in participation Employment: Retired   PATIENT GOALS    pt's husband reports "improve confidence and participation in tasks" pt agreed with statement   SUBJECTIVE STATEMENT: Pt and his missed their appt earlier in the week Pt accompanied by: significant other  OBJECTIVE:  TODAY'S TREATMENT:   Skilled treatment session focused on pt's cognitive linguistic communication goals. SLP facilitated session by providing the following interventions:  When pt missed her session on Monday, this writer called pt to inquire about missed appt. Pt answered the phone and was able to communicate "reschedule."   When arriving to this session, pt stated "dentist, he was at the dentist" indicating that was the reason appt was missed. However, he husband stated he had forgotten about an appt he had at the [facility].    Pt's husband reports increased involvement in activities - "Well I had Fannie Knee out, we went to the dump several times, I put  her to work" He states that he would like to have her remember more while out shopping since she isn't able to read a list anymore. SLP introduced pt and husband to having text message read aloud by iphone. SLP facilitated by changing pt settings and moderate A pt able to activate. Recommend that her husband send texts as well as list via text for pt to practice .     PATIENT EDUCATION: Education details: see above Person educated: Patient and Spouse Education method:  Explanation Education comprehension: verbalized understanding  HOME EXERCISE PROGRAM:  Increase participation in daily activities    GOALS:  Goals reviewed with patient? Yes  SHORT TERM GOALS: Target date: 10 sessions  With moderate assistance, pt will demonstrate selective attention to task for 10 minutes in a moderately distracting environment.  Baseline: Goal status: INITIAL  2.  Pt and her husband will assist in creating scripts of basic wants/needs to promote functional language of wants/needs.  Baseline:  Goal status: INITIAL   LONG TERM GOALS: Target date: 02/20/2024  With Moderate A, patient will use a circumlocution strategy, writing, drawing, and/or gesturing to describe target words with 75% accuracy to improve word-finding and reduce communication breakdowns, Baseline:  Goal status: INITIAL  2.  With Min A, patient/family will demonstrate understanding of the following concepts: primary progressive aphasia, communication vs conversation, strengths/strategies to promote success, local resources by answering multiple choice questions with 75% accuracy when provided supported conversation in order to increase patient's participation in medical care.  Baseline:  Goal status: INITIAL  ASSESSMENT:  CLINICAL IMPRESSION: Patient is a 73 y.o. right handed female who was seen today for a cognitive communication treatment. She presents with progressive cognitive deficits related to diagnosis of Primary Progressive Aphasia (nonfluent variant). Pt with noted decreased attention to task, increased response times, short term memory deficits impacting her ability to recall locations of icons on hs AAC, difficulty with problem solving basic tasks such as plugging in her phone and decreased task initiation/activity participation.   Expressive language abilities are c/b halting speech with increased distortions of phonemes suspicious for motor speech impairment In addition, her  responses are delayed and consistent mostly of noun/verb (3-4 words). Receptive language deficits are moderate and appear to have worsened impacting her ability to understand conversation and yes/no questions.  Pt and her husband report that she is not longer able to read or spell.    Pt with the support of her husband continue to implement strategies/recommendations provided within the session. See the above treatment note for details.   OBJECTIVE IMPAIRMENTS include memory, expressive language, receptive language, and apraxia. These impairments are limiting patient from managing medications, managing appointments, managing finances, household responsibilities, ADLs/IADLs, and effectively communicating at home and in community. Factors affecting potential to achieve goals and functional outcome are ability to learn/carryover information, co-morbidities, medical prognosis, previous level of function, and severity of impairments. Patient will benefit from skilled SLP services to address above impairments and improve overall function.  REHAB POTENTIAL: Fair nuerodegenerative disorder  PLAN: SLP FREQUENCY: 1-2x/week  SLP DURATION: 8 weeks  PLANNED INTERVENTIONS: Language facilitation, Environmental controls, Cueing hierachy, Internal/external aids, Functional tasks, Multimodal communication approach, SLP instruction and feedback, Compensatory strategies, and Patient/family education    Kennadi Albany B. Dreama Saa, M.S., CCC-SLP, Tree surgeon Certified Brain Injury Specialist [facility]  [facility] Rehabilitation Services Office [phone] Ascom [phone] Fax [phone]

## 2024-01-28 ENCOUNTER — Ambulatory Visit: Payer: Medicare HMO | Admitting: Speech Pathology

## 2024-01-28 DIAGNOSIS — R4701 Aphasia: Secondary | ICD-10-CM | POA: Diagnosis not present

## 2024-01-28 DIAGNOSIS — R41841 Cognitive communication deficit: Secondary | ICD-10-CM

## 2024-01-28 NOTE — Therapy (Signed)
 OUTPATIENT SPEECH LANGUAGE PATHOLOGY TREATMENT NOTE  Patient Name: Carmen Gonzalez MRN: [id number] DOB:30-Mar-1951, 73 y.o., female Today's Date: 01/28/2024  PCP: Maurine Minister, MD REFERRING PROVIDER: Cristopher Peru, MD    End of Session - 01/28/24 1137     Visit Number 6    Number of Visits 17    Date for SLP Re-Evaluation 02/20/24    Authorization Type [employer] Medicare HMO/PPO    Progress Note Due on Visit 10    SLP Start Time 1135    SLP Stop Time  1205    SLP Time Calculation (min) 30 min    Activity Tolerance Patient tolerated treatment well             Past Medical History:  Diagnosis Date   Allergy    Arthritis    Asthma    Cataract cortical, senile    COPD (chronic obstructive pulmonary disease) (HCC)    Deviated septum    Diabetes mellitus without complication (HCC)    Glaucoma    Hypercholesteremia    Hyperlipidemia    Hypertension    Obesity    Tuberculosis    Past Surgical History:  Procedure Laterality Date   COLONOSCOPY WITH PROPOFOL N/A 01/17/2016   Procedure: COLONOSCOPY WITH PROPOFOL;  Surgeon: Scot Jun, MD;  Location: [facility] ENDOSCOPY;  Service: Endoscopy;  Laterality: N/A;   COLONOSCOPY WITH PROPOFOL N/A 05/04/2021   Procedure: COLONOSCOPY WITH PROPOFOL;  Surgeon: Toledo, Boykin Nearing, MD;  Location: [facility] ENDOSCOPY;  Service: Gastroenterology;  Laterality: N/A;   CT ARTHROGRAM KNEE RT WO ([facility] HX)     NASAL FRACTURE SURGERY     There are no active problems to display for this patient.   ONSET DATE: 07/2018 onset of symptoms;  date of referral 12/10/2023  REFERRING DIAG: R41.89 (ICD-10-CM) - Cognitive change   THERAPY DIAG:  Aphasia  Cognitive communication deficit  Rationale for Evaluation and Treatment Rehabilitation  SUBJECTIVE:   PERTINENT HISTORY: Pt is a right handed 73 year old with diagnosis of Primary Progressive Aphasia (nonfluent variant). Previous courses of Outpatient ST services 04/2019-08/2019 and 10/2021-01/2022. Pt  also has an AAC from [place].    DIAGNOSTIC FINDINGS:  12/17/2023 - MRI Parietal lob predominant parenchymal volume loss and probably chronic microvascular ischemic changes. No acute intracranial abnormality.    PAIN:  Are you having pain? No  FALLS: Has patient fallen in last 6 months?  No  LIVING ENVIRONMENT: Lives with: lives with their spouse Lives in: House/apartment  PLOF:  Level of assistance: Independent with ADLs, Independent with IADLs, Comment: decrease interest in participation Employment: Retired   PATIENT GOALS    pt's husband reports "improve confidence and participation in tasks" pt agreed with statement   SUBJECTIVE STATEMENT: Pt and her husband are pleasant and eager - Pt with primarily one word answer Pt accompanied by: significant other  OBJECTIVE:  TODAY'S TREATMENT:   Skilled treatment session focused on pt's cognitive linguistic communication goals. SLP facilitated session by providing the following interventions:  Pt's husband continues to report increased involvement in activities  Pt with the following emotional questions "how rapidly will it (PPA) progress, how much time before she is unable to communicate completely, how much time before end of life?" Education provided on progressive nature of PPA/FTD with review of most recent imaging. Education also provided with handouts provided on advanced directives especially for pt's husband as pt is not likely able to understand or communicate his wishes should he become medically ill - Recommend they  complete sooner vs later  PATIENT EDUCATION: Education details: see above Person educated: Patient and Spouse Education method: Explanation Education comprehension: verbalized understanding  HOME EXERCISE PROGRAM:  Increase participation in daily activities    GOALS:  Goals reviewed with patient? Yes  SHORT TERM GOALS: Target date: 10 sessions  With moderate assistance, pt will demonstrate  selective attention to task for 10 minutes in a moderately distracting environment.  Baseline: Goal status: INITIAL  2.  Pt and her husband will assist in creating scripts of basic wants/needs to promote functional language of wants/needs.  Baseline:  Goal status: INITIAL   LONG TERM GOALS: Target date: 02/20/2024  With Moderate A, patient will use a circumlocution strategy, writing, drawing, and/or gesturing to describe target words with 75% accuracy to improve word-finding and reduce communication breakdowns, Baseline:  Goal status: INITIAL  2.  With Min A, patient/family will demonstrate understanding of the following concepts: primary progressive aphasia, communication vs conversation, strengths/strategies to promote success, local resources by answering multiple choice questions with 75% accuracy when provided supported conversation in order to increase patient's participation in medical care.  Baseline:  Goal status: INITIAL  ASSESSMENT:  CLINICAL IMPRESSION: Patient is a 73 y.o. right handed female who was seen today for a cognitive communication treatment. She presents with progressive cognitive deficits related to diagnosis of Primary Progressive Aphasia (nonfluent variant). Pt with noted decreased attention to task, increased response times, short term memory deficits impacting her ability to recall locations of icons on hs AAC, difficulty with problem solving basic tasks such as plugging in her phone and decreased task initiation/activity participation.   Expressive language abilities are c/b halting speech with increased distortions of phonemes suspicious for motor speech impairment In addition, her responses are delayed and consistent mostly of noun/verb (3-4 words). Receptive language deficits are moderate and appear to have worsened impacting her ability to understand conversation and yes/no questions.  Pt and her husband report that she is not longer able to read or spell.     Pt with the support of her husband continue to implement strategies/recommendations provided within the session. See the above treatment note for details.   OBJECTIVE IMPAIRMENTS include memory, expressive language, receptive language, and apraxia. These impairments are limiting patient from managing medications, managing appointments, managing finances, household responsibilities, ADLs/IADLs, and effectively communicating at home and in community. Factors affecting potential to achieve goals and functional outcome are ability to learn/carryover information, co-morbidities, medical prognosis, previous level of function, and severity of impairments. Patient will benefit from skilled SLP services to address above impairments and improve overall function.  REHAB POTENTIAL: Fair nuerodegenerative disorder  PLAN: SLP FREQUENCY: 1-2x/week  SLP DURATION: 8 weeks  PLANNED INTERVENTIONS: Language facilitation, Environmental controls, Cueing hierachy, Internal/external aids, Functional tasks, Multimodal communication approach, SLP instruction and feedback, Compensatory strategies, and Patient/family education    Samiksha Pellicano B. Dreama Saa, M.S., CCC-SLP, Tree surgeon Certified Brain Injury Specialist [facility]  [facility] Rehabilitation Services Office [phone] Ascom [phone] Fax [phone]

## 2024-01-30 ENCOUNTER — Ambulatory Visit: Payer: Medicare HMO | Admitting: Speech Pathology

## 2024-01-30 DIAGNOSIS — R4701 Aphasia: Secondary | ICD-10-CM

## 2024-01-30 DIAGNOSIS — R41841 Cognitive communication deficit: Secondary | ICD-10-CM

## 2024-01-30 DIAGNOSIS — F0280 Dementia in other diseases classified elsewhere without behavioral disturbance: Secondary | ICD-10-CM

## 2024-01-30 NOTE — Therapy (Signed)
 OUTPATIENT SPEECH LANGUAGE PATHOLOGY TREATMENT NOTE  Patient Name: Carmen Gonzalez MRN: [id number] DOB:March 10, 1951, 73 y.o., female Today's Date: 01/30/2024  PCP: Maurine Minister, MD REFERRING PROVIDER: Cristopher Peru, MD    End of Session - 01/30/24 1107     Visit Number 7    Number of Visits 17    Date for SLP Re-Evaluation 02/20/24    Authorization Type [employer] HMO/PPO    Progress Note Due on Visit 10    SLP Start Time 1100    SLP Stop Time  1135    SLP Time Calculation (min) 35 min    Activity Tolerance Patient tolerated treatment well             Past Medical History:  Diagnosis Date   Allergy    Arthritis    Asthma    Cataract cortical, senile    COPD (chronic obstructive pulmonary disease) (HCC)    Deviated septum    Diabetes mellitus without complication (HCC)    Glaucoma    Hypercholesteremia    Hyperlipidemia    Hypertension    Obesity    Tuberculosis    Past Surgical History:  Procedure Laterality Date   COLONOSCOPY WITH PROPOFOL N/A 01/17/2016   Procedure: COLONOSCOPY WITH PROPOFOL;  Surgeon: Scot Jun, MD;  [facility]: [facility] ENDOSCOPY;  Service: Endoscopy;  Laterality: N/A;   COLONOSCOPY WITH PROPOFOL N/A 05/04/2021   Procedure: COLONOSCOPY WITH PROPOFOL;  Surgeon: Toledo, Boykin Nearing, MD;  Location: [facility] ENDOSCOPY;  Service: Gastroenterology;  Laterality: N/A;   CT ARTHROGRAM KNEE RT WO ([facility] HX)     NASAL FRACTURE SURGERY     There are no active problems to display for this patient.   ONSET DATE: 07/2018 onset of symptoms;  date of referral 12/10/2023  REFERRING DIAG: R41.89 (ICD-10-CM) - Cognitive change   THERAPY DIAG:  Aphasia  Cognitive communication deficit  Primary progressive aphasia (HCC)  Rationale for Evaluation and Treatment Rehabilitation  SUBJECTIVE:   PERTINENT HISTORY: Pt is a right handed 73 year old with diagnosis of Primary Progressive Aphasia (nonfluent variant). Previous courses of Outpatient ST services  04/2019-08/2019 and 10/2021-01/2022. Pt also has an AAC from [place].    DIAGNOSTIC FINDINGS:  12/17/2023 - MRI Parietal lob predominant parenchymal volume loss and probably chronic microvascular ischemic changes. No acute intracranial abnormality.    PAIN:  Are you having pain? No  FALLS: Has patient fallen in last 6 months?  No  LIVING ENVIRONMENT: Lives with: lives with their spouse Lives in: House/apartment  PLOF:  Level of assistance: Independent with ADLs, Independent with IADLs, Comment: decrease interest in participation Employment: Retired   PATIENT GOALS    pt's husband reports "improve confidence and participation in tasks" pt agreed with statement   SUBJECTIVE STATEMENT: Pt and her husband are pleasant and eager - Pt with primarily one word answer Pt accompanied by: significant other  OBJECTIVE:  TODAY'S TREATMENT:   Skilled treatment session focused on pt's cognitive linguistic communication goals. SLP facilitated session by providing the following interventions:  Pt's husband continues to report increased involvement in activities - "I got her out in the yard with me yesterday"  Pt's husband reports they haven't looked at advanced directives yet but will - Although pt continues with delayed responses, her responses more timely when responding to yes/no questions and she continues to have > 90% accuracy with her responses to yes/no questions - pt also observed gesturing more today when responding to questions such as gesturing with hands to  indicate that she gets a 12 inch sub at [employer] -   PATIENT EDUCATION: Education details: see above Person educated: Patient and Spouse Education method: Explanation Education comprehension: verbalized understanding  HOME EXERCISE PROGRAM:  Increase participation in daily activities    GOALS:  Goals reviewed with patient? Yes  SHORT TERM GOALS: Target date: 10 sessions  With moderate assistance, pt will  demonstrate selective attention to task for 10 minutes in a moderately distracting environment.  Baseline: Goal status: INITIAL  2.  Pt and her husband will assist in creating scripts of basic wants/needs to promote functional language of wants/needs.  Baseline:  Goal status: INITIAL   LONG TERM GOALS: Target date: 02/20/2024  With Moderate A, patient will use a circumlocution strategy, writing, drawing, and/or gesturing to describe target words with 75% accuracy to improve word-finding and reduce communication breakdowns, Baseline:  Goal status: INITIAL  2.  With Min A, patient/family will demonstrate understanding of the following concepts: primary progressive aphasia, communication vs conversation, strengths/strategies to promote success, local resources by answering multiple choice questions with 75% accuracy when provided supported conversation in order to increase patient's participation in medical care.  Baseline:  Goal status: INITIAL  ASSESSMENT:  CLINICAL IMPRESSION: Patient is a 73 y.o. right handed female who was seen today for a cognitive communication treatment. She presents with progressive cognitive deficits related to diagnosis of Primary Progressive Aphasia (nonfluent variant). Pt with noted decreased attention to task, increased response times, short term memory deficits impacting her ability to recall locations of icons on hs AAC, difficulty with problem solving basic tasks such as plugging in her phone and decreased task initiation/activity participation.   Expressive language abilities are c/b halting speech with increased distortions of phonemes suspicious for motor speech impairment In addition, her responses are delayed and consistent mostly of noun/verb (3-4 words). Receptive language deficits are moderate and appear to have worsened impacting her ability to understand conversation and yes/no questions.  Pt and her husband report that she is not longer able to read  or spell.    Pt with the support of her husband continue to implement strategies/recommendations provided within the session. See the above treatment note for details.   OBJECTIVE IMPAIRMENTS include memory, expressive language, receptive language, and apraxia. These impairments are limiting patient from managing medications, managing appointments, managing finances, household responsibilities, ADLs/IADLs, and effectively communicating at home and in community. Factors affecting potential to achieve goals and functional outcome are ability to learn/carryover information, co-morbidities, medical prognosis, previous level of function, and severity of impairments. Patient will benefit from skilled SLP services to address above impairments and improve overall function.  REHAB POTENTIAL: Fair nuerodegenerative disorder  PLAN: SLP FREQUENCY: 1-2x/week  SLP DURATION: 8 weeks  PLANNED INTERVENTIONS: Language facilitation, Environmental controls, Cueing hierachy, Internal/external aids, Functional tasks, Multimodal communication approach, SLP instruction and feedback, Compensatory strategies, and Patient/family education    Alissa Pharr B. Dreama Saa, M.S., CCC-SLP, Tree surgeon Certified Brain Injury Specialist [facility]  [facility] Rehabilitation Services Office [phone] Ascom [phone] Fax [phone]

## 2024-02-05 ENCOUNTER — Ambulatory Visit: Payer: Medicare HMO | Admitting: Speech Pathology

## 2024-02-06 ENCOUNTER — Ambulatory Visit: Payer: Medicare HMO | Admitting: Speech Pathology

## 2024-02-06 DIAGNOSIS — R4701 Aphasia: Secondary | ICD-10-CM | POA: Diagnosis not present

## 2024-02-06 DIAGNOSIS — R41841 Cognitive communication deficit: Secondary | ICD-10-CM

## 2024-02-06 NOTE — Therapy (Signed)
 OUTPATIENT SPEECH LANGUAGE PATHOLOGY TREATMENT NOTE  Patient Name: Carmen Gonzalez MRN: [id number] DOB:14-Mar-1951, 73 y.o., female Today's Date: 02/06/2024  PCP: Maurine Minister, MD REFERRING PROVIDER: Cristopher Peru, MD    End of Session - 02/06/24 1101     Visit Number 8    Number of Visits 17    Date for SLP Re-Evaluation 02/20/24    Authorization Type [employer] Medicare HMO/PPO    Progress Note Due on Visit 10    SLP Start Time 1100    SLP Stop Time  1135    SLP Time Calculation (min) 35 min    Activity Tolerance Patient tolerated treatment well             Past Medical History:  Diagnosis Date   Allergy    Arthritis    Asthma    Cataract cortical, senile    COPD (chronic obstructive pulmonary disease) (HCC)    Deviated septum    Diabetes mellitus without complication (HCC)    Glaucoma    Hypercholesteremia    Hyperlipidemia    Hypertension    Obesity    Tuberculosis    Past Surgical History:  Procedure Laterality Date   COLONOSCOPY WITH PROPOFOL N/A 01/17/2016   Procedure: COLONOSCOPY WITH PROPOFOL;  Surgeon: Scot Jun, MD;  Location: [facility] ENDOSCOPY;  Service: Endoscopy;  Laterality: N/A;   COLONOSCOPY WITH PROPOFOL N/A 05/04/2021   Procedure: COLONOSCOPY WITH PROPOFOL;  Surgeon: Toledo, Boykin Nearing, MD;  Location: [facility] ENDOSCOPY;  Service: Gastroenterology;  Laterality: N/A;   CT ARTHROGRAM KNEE RT WO ([facility] HX)     NASAL FRACTURE SURGERY     There are no active problems to display for this patient.   ONSET DATE: 07/2018 onset of symptoms;  date of referral 12/10/2023  REFERRING DIAG: R41.89 (ICD-10-CM) - Cognitive change   THERAPY DIAG:  Aphasia  Cognitive communication deficit  Rationale for Evaluation and Treatment Rehabilitation  SUBJECTIVE:   PERTINENT HISTORY: Pt is a right handed 73 year old with diagnosis of Primary Progressive Aphasia (nonfluent variant). Previous courses of Outpatient ST services 04/2019-08/2019 and 10/2021-01/2022. Pt  also has an AAC from [place].    DIAGNOSTIC FINDINGS:  12/17/2023 - MRI Parietal lob predominant parenchymal volume loss and probably chronic microvascular ischemic changes. No acute intracranial abnormality.    PAIN:  Are you having pain? No  FALLS: Has patient fallen in last 6 months?  No  LIVING ENVIRONMENT: Lives with: lives with their spouse Lives in: House/apartment  PLOF:  Level of assistance: Independent with ADLs, Independent with IADLs, Comment: decrease interest in participation Employment: Retired   PATIENT GOALS    pt's husband reports "improve confidence and participation in tasks" pt agreed with statement   SUBJECTIVE STATEMENT: Pt and her husband are pleasant and eager - pt reports special event for [place] Day Pt accompanied by: significant other  OBJECTIVE:  TODAY'S TREATMENT:   Skilled treatment session focused on pt's cognitive linguistic communication goals. SLP facilitated session by providing the following interventions:  Pt and her husband continue to report increased participation in activities throughout the day - she did the laundry yesterday. Pt with increased use of social greetings during today's session as well as decreased response time.    PATIENT EDUCATION: Education details: see above Person educated: Patient and Spouse Education method: Explanation Education comprehension: verbalized understanding  HOME EXERCISE PROGRAM:  Increase participation in daily activities    GOALS:  Goals reviewed with patient? Yes  SHORT TERM GOALS: Target date:  10 sessions  With moderate assistance, pt will demonstrate selective attention to task for 10 minutes in a moderately distracting environment.  Baseline: Goal status: INITIAL  2.  Pt and her husband will assist in creating scripts of basic wants/needs to promote functional language of wants/needs.  Baseline:  Goal status: INITIAL   LONG TERM GOALS: Target date:  02/20/2024  With Moderate A, patient will use a circumlocution strategy, writing, drawing, and/or gesturing to describe target words with 75% accuracy to improve word-finding and reduce communication breakdowns, Baseline:  Goal status: INITIAL  2.  With Min A, patient/family will demonstrate understanding of the following concepts: primary progressive aphasia, communication vs conversation, strengths/strategies to promote success, local resources by answering multiple choice questions with 75% accuracy when provided supported conversation in order to increase patient's participation in medical care.  Baseline:  Goal status: INITIAL  ASSESSMENT:  CLINICAL IMPRESSION: Patient is a 73 y.o. right handed female who was seen today for a cognitive communication treatment. She presents with progressive cognitive deficits related to diagnosis of Primary Progressive Aphasia (nonfluent variant). Pt with noted decreased attention to task, increased response times, short term memory deficits impacting her ability to recall locations of icons on hs AAC, difficulty with problem solving basic tasks such as plugging in her phone and decreased task initiation/activity participation.   Expressive language abilities are c/b halting speech with increased distortions of phonemes suspicious for motor speech impairment In addition, her responses are delayed and consistent mostly of noun/verb (3-4 words). Receptive language deficits are moderate and appear to have worsened impacting her ability to understand conversation and yes/no questions.  Pt and her husband report that she is not longer able to read or spell.    Pt with the support of her husband continue to implement strategies/recommendations provided within the session. See the above treatment note for details.   OBJECTIVE IMPAIRMENTS include memory, expressive language, receptive language, and apraxia. These impairments are limiting patient from managing  medications, managing appointments, managing finances, household responsibilities, ADLs/IADLs, and effectively communicating at home and in community. Factors affecting potential to achieve goals and functional outcome are ability to learn/carryover information, co-morbidities, medical prognosis, previous level of function, and severity of impairments. Patient will benefit from skilled SLP services to address above impairments and improve overall function.  REHAB POTENTIAL: Fair nuerodegenerative disorder  PLAN: SLP FREQUENCY: 1-2x/week  SLP DURATION: 8 weeks  PLANNED INTERVENTIONS: Language facilitation, Environmental controls, Cueing hierachy, Internal/external aids, Functional tasks, Multimodal communication approach, SLP instruction and feedback, Compensatory strategies, and Patient/family education    Maisa Bedingfield B. Dreama Saa, M.S., CCC-SLP, Tree surgeon Certified Brain Injury Specialist [facility]  [facility] Rehabilitation Services Office [phone] Ascom [phone] Fax [phone]

## 2024-02-12 ENCOUNTER — Ambulatory Visit: Payer: Medicare HMO | Admitting: Speech Pathology

## 2024-02-13 ENCOUNTER — Ambulatory Visit: Payer: Medicare HMO | Admitting: Speech Pathology

## 2024-02-13 DIAGNOSIS — R41841 Cognitive communication deficit: Secondary | ICD-10-CM

## 2024-02-13 DIAGNOSIS — R4701 Aphasia: Secondary | ICD-10-CM

## 2024-02-13 NOTE — Therapy (Signed)
 OUTPATIENT SPEECH LANGUAGE PATHOLOGY TREATMENT NOTE  Patient Name: Carmen Gonzalez MRN: [id number] DOB:10-15-1951, 73 y.o., female Today's Date: 02/13/2024  PCP: Maurine Minister, MD REFERRING PROVIDER: Cristopher Peru, MD    End of Session - 02/13/24 1108     Visit Number 9    Number of Visits 17    Date for SLP Re-Evaluation 02/20/24    Authorization Type [employer] Medicare HMO/PPO    Progress Note Due on Visit 10    SLP Start Time 1100    SLP Stop Time  1145    SLP Time Calculation (min) 45 min    Activity Tolerance Patient tolerated treatment well             Past Medical History:  Diagnosis Date   Allergy    Arthritis    Asthma    Cataract cortical, senile    COPD (chronic obstructive pulmonary disease) (HCC)    Deviated septum    Diabetes mellitus without complication (HCC)    Glaucoma    Hypercholesteremia    Hyperlipidemia    Hypertension    Obesity    Tuberculosis    Past Surgical History:  Procedure Laterality Date   COLONOSCOPY WITH PROPOFOL N/A 01/17/2016   Procedure: COLONOSCOPY WITH PROPOFOL;  Surgeon: Scot Jun, MD;  Location: [facility] ENDOSCOPY;  Service: Endoscopy;  Laterality: N/A;   COLONOSCOPY WITH PROPOFOL N/A 05/04/2021   Procedure: COLONOSCOPY WITH PROPOFOL;  Surgeon: Toledo, Boykin Nearing, MD;  Location: [facility] ENDOSCOPY;  Service: Gastroenterology;  Laterality: N/A;   CT ARTHROGRAM KNEE RT WO ([facility] HX)     NASAL FRACTURE SURGERY     There are no active problems to display for this patient.   ONSET DATE: 07/2018 onset of symptoms;  date of referral 12/10/2023  REFERRING DIAG: R41.89 (ICD-10-CM) - Cognitive change   THERAPY DIAG:  Aphasia  Cognitive communication deficit  Rationale for Evaluation and Treatment Rehabilitation  SUBJECTIVE:   PERTINENT HISTORY: Pt is a right handed 73 year old with diagnosis of Primary Progressive Aphasia (nonfluent variant). Previous courses of Outpatient ST services 04/2019-08/2019 and 10/2021-01/2022. Pt  also has an AAC from [place].    DIAGNOSTIC FINDINGS:  12/17/2023 - MRI Parietal lob predominant parenchymal volume loss and probably chronic microvascular ischemic changes. No acute intracranial abnormality.    PAIN:  Are you having pain? No  FALLS: Has patient fallen in last 6 months?  No  LIVING ENVIRONMENT: Lives with: lives with their spouse Lives in: House/apartment  PLOF:  Level of assistance: Independent with ADLs, Independent with IADLs, Comment: decrease interest in participation Employment: Retired   PATIENT GOALS    pt's husband reports "improve confidence and participation in tasks" pt agreed with statement   SUBJECTIVE STATEMENT: Pt and her husband are pleasant and eager -  Pt accompanied by: significant other  OBJECTIVE:  TODAY'S TREATMENT:   Skilled treatment session focused on pt's cognitive linguistic communication goals. SLP facilitated session by providing the following interventions:  Pt reports that she went to the dump with halting speech she stated "go to the dump."  They also report going for a walk yesterday. She is going to help me make home fries.   During moments of word finding difficulty pt has tendency to start laughing and then her husband laughs as well. She has a very contagious laugh. While this is a pleasant response, pt has few compensatory strategies during moments of word finding at home. During today's session, her responses to this wrier's questions were  less delayed, one word answers.   PATIENT EDUCATION: Education details: see above Person educated: Patient and Spouse Education method: Explanation Education comprehension: verbalized understanding  HOME EXERCISE PROGRAM:  Increase participation in daily activities    GOALS:  Goals reviewed with patient? Yes  SHORT TERM GOALS: Target date: 10 sessions  With moderate assistance, pt will demonstrate selective attention to task for 10 minutes in a moderately distracting  environment.  Baseline: Goal status: INITIAL  2.  Pt and her husband will assist in creating scripts of basic wants/needs to promote functional language of wants/needs.  Baseline:  Goal status: INITIAL   LONG TERM GOALS: Target date: 02/20/2024  With Moderate A, patient will use a circumlocution strategy, writing, drawing, and/or gesturing to describe target words with 75% accuracy to improve word-finding and reduce communication breakdowns, Baseline:  Goal status: INITIAL  2.  With Min A, patient/family will demonstrate understanding of the following concepts: primary progressive aphasia, communication vs conversation, strengths/strategies to promote success, local resources by answering multiple choice questions with 75% accuracy when provided supported conversation in order to increase patient's participation in medical care.  Baseline:  Goal status: INITIAL  ASSESSMENT:  CLINICAL IMPRESSION: Patient is a 73 y.o. right handed female who was seen today for a cognitive communication treatment. She presents with progressive cognitive deficits related to diagnosis of Primary Progressive Aphasia (nonfluent variant). Pt with noted decreased attention to task, increased response times, short term memory deficits impacting her ability to recall locations of icons on hs AAC, difficulty with problem solving basic tasks such as plugging in her phone and decreased task initiation/activity participation.   Expressive language abilities are c/b halting speech with increased distortions of phonemes suspicious for motor speech impairment In addition, her responses are delayed and consistent mostly of noun/verb (3-4 words). Receptive language deficits are moderate and appear to have worsened impacting her ability to understand conversation and yes/no questions.  Pt and her husband report that she is not longer able to read or spell.    Pt with the support of her husband continue to implement  strategies/recommendations provided within the session. See the above treatment note for details.   OBJECTIVE IMPAIRMENTS include memory, expressive language, receptive language, and apraxia. These impairments are limiting patient from managing medications, managing appointments, managing finances, household responsibilities, ADLs/IADLs, and effectively communicating at home and in community. Factors affecting potential to achieve goals and functional outcome are ability to learn/carryover information, co-morbidities, medical prognosis, previous level of function, and severity of impairments. Patient will benefit from skilled SLP services to address above impairments and improve overall function.  REHAB POTENTIAL: Fair neurodegenerative disorder  PLAN: SLP FREQUENCY: 1-2x/week  SLP DURATION: 8 weeks  PLANNED INTERVENTIONS: Language facilitation, Environmental controls, Cueing hierachy, Internal/external aids, Functional tasks, Multimodal communication approach, SLP instruction and feedback, Compensatory strategies, and Patient/family education    Foy Vanduyne B. Dreama Saa, M.S., CCC-SLP, Tree surgeon Certified Brain Injury Specialist [facility]  [facility] Rehabilitation Services Office [phone] Ascom [phone] Fax [phone]

## 2024-02-20 ENCOUNTER — Ambulatory Visit: Payer: Medicare HMO | Attending: Neurology | Admitting: Speech Pathology

## 2024-02-20 DIAGNOSIS — R41841 Cognitive communication deficit: Secondary | ICD-10-CM | POA: Insufficient documentation

## 2024-02-20 DIAGNOSIS — R4701 Aphasia: Secondary | ICD-10-CM | POA: Insufficient documentation

## 2024-02-20 NOTE — Therapy (Signed)
 OUTPATIENT SPEECH LANGUAGE PATHOLOGY TREATMENT NOTE 10th VISIT PROGRESS NOTE RE-CERTIFICATION REQUEST   Patient Name: Carmen Gonzalez MRN: [id number] DOB:August 04, 1951, 73 y.o., female Today's Date: 02/20/2024  PCP: Maurine Minister, MD REFERRING PROVIDER: Cristopher Peru, MD   Speech Therapy Progress Note  Dates of Reporting Period: 12/26/2023 to 02/20/2024  Objective: Patient has been seen for 10 speech therapy sessions this reporting period targeting pt's language and cognitive deficits d/t PPA. Marland Kitchen Patient is making slow steady progress toward her STG and LTGs this reporting period. See skilled intervention, clinical impressions, and goals below for details.    End of Session - 02/20/24 1725     Visit Number 10    Number of Visits 18    Date for SLP Re-Evaluation 04/16/24    Authorization Type [employer] Medicare HMO/PPO    Progress Note Due on Visit 10    SLP Start Time 1100    SLP Stop Time  1140    SLP Time Calculation (min) 40 min    Activity Tolerance Patient tolerated treatment well             Past Medical History:  Diagnosis Date   Allergy    Arthritis    Asthma    Cataract cortical, senile    COPD (chronic obstructive pulmonary disease) (HCC)    Deviated septum    Diabetes mellitus without complication (HCC)    Glaucoma    Hypercholesteremia    Hyperlipidemia    Hypertension    Obesity    Tuberculosis    Past Surgical History:  Procedure Laterality Date   COLONOSCOPY WITH PROPOFOL N/A 01/17/2016   Procedure: COLONOSCOPY WITH PROPOFOL;  Surgeon: Scot Jun, MD;  Location: [facility] ENDOSCOPY;  Service: Endoscopy;  Laterality: N/A;   COLONOSCOPY WITH PROPOFOL N/A 05/04/2021   Procedure: COLONOSCOPY WITH PROPOFOL;  Surgeon: Toledo, Boykin Nearing, MD;  Location: [facility] ENDOSCOPY;  Service: Gastroenterology;  Laterality: N/A;   CT ARTHROGRAM KNEE RT WO ([facility] HX)     NASAL FRACTURE SURGERY     There are no active problems to display for this patient.   ONSET  DATE: 07/2018 onset of symptoms;  date of referral 12/10/2023  REFERRING DIAG: R41.89 (ICD-10-CM) - Cognitive change   THERAPY DIAG:  Aphasia  Cognitive communication deficit  Rationale for Evaluation and Treatment Rehabilitation  SUBJECTIVE:   PERTINENT HISTORY: Pt is a right handed 73 year old with diagnosis of Primary Progressive Aphasia (nonfluent variant). Previous courses of Outpatient ST services 04/2019-08/2019 and 10/2021-01/2022. Pt also has an AAC from [place].    DIAGNOSTIC FINDINGS:  12/17/2023 - MRI Parietal lob predominant parenchymal volume loss and probably chronic microvascular ischemic changes. No acute intracranial abnormality.    PAIN:  Are you having pain? No  FALLS: Has patient fallen in last 6 months?  No  LIVING ENVIRONMENT: Lives with: lives with their spouse Lives in: House/apartment  PLOF:  Level of assistance: Independent with ADLs, Independent with IADLs, Comment: decrease interest in participation Employment: Retired   PATIENT GOALS    pt's husband reports "improve confidence and participation in tasks" pt agreed with statement   SUBJECTIVE STATEMENT: Pt and her husband are pleasant and eager -  Pt accompanied by: significant other  OBJECTIVE:  TODAY'S TREATMENT:   Skilled treatment session focused on pt's cognitive linguistic communication goals. SLP facilitated session by providing the following interventions:  Pt's husband facilitates conversation by aiding gently correcting pt's responses to SLP basic questions/conversational topics. SLP attempted to aid in communication by  presenting 2 verbal choices to basic questions. While pt responses with decreased wait time, her responses appear incorrect (per her husband) with no awareness by pt in ~ 50% of opportunities.  Pt and her husband continue to respond with laughter and compassion.    PATIENT EDUCATION: Education details: see above Person educated: Patient and Spouse Education  method: Explanation Education comprehension: verbalized understanding  HOME EXERCISE PROGRAM:  Increase participation in daily activities    GOALS:  Goals reviewed with patient? Yes  SHORT TERM GOALS: Target date: 10 sessions  Updated 02/20/2024 With moderate assistance, pt will demonstrate selective attention to task for 10 minutes in a moderately distracting environment.  Baseline: Goal status: INITIAL: MET upgraded to with supervision assistance, pt will demonstrate selective attention to task for 10 minutes in a moderately distracting environment.   2.  Pt and her husband will assist in creating scripts of basic wants/needs to promote functional language of wants/needs.  Baseline:  Goal status: INITIAL: progress made   LONG TERM GOALS: Target date: 04/16/2024  Updated 02/20/2024 With Moderate A, patient will use a circumlocution strategy, writing, drawing, and/or gesturing to describe target words with 75% accuracy to improve word-finding and reduce communication breakdowns, Baseline:  Goal status: INITIAL: discharged d/t pt inability  2.  With Min A, patient/family will demonstrate understanding of the following concepts: primary progressive aphasia, communication vs conversation, strengths/strategies to promote success, local resources by answering multiple choice questions with 75% accuracy when provided supported conversation in order to increase patient's participation in medical care.  Baseline:  Goal status: INITIAL: progress made  ASSESSMENT:  CLINICAL IMPRESSION: Patient is a 73 y.o. right handed female who was seen today for a cognitive communication treatment. She presents with progressive cognitive deficits related to diagnosis of Primary Progressive Aphasia (nonfluent variant). Pt with noted decreased attention to task, increased response times, short term memory deficits impacting her ability to recall locations of icons on hs AAC, difficulty with problem solving  basic tasks such as plugging in her phone and decreased task initiation/activity participation.   Expressive language abilities are c/b halting speech with increased distortions of phonemes suspicious for motor speech impairment In addition, her responses are delayed and consistent mostly of noun/verb (3-4 words). Receptive language deficits are moderate and appear to have worsened impacting her ability to understand conversation and yes/no questions.  Pt and her husband report that she is not longer able to read or spell.    Pt with the support of her husband continue to implement strategies/recommendations provided within the session. See the above treatment note for details.   OBJECTIVE IMPAIRMENTS include memory, expressive language, receptive language, and apraxia. These impairments are limiting patient from managing medications, managing appointments, managing finances, household responsibilities, ADLs/IADLs, and effectively communicating at home and in community. Factors affecting potential to achieve goals and functional outcome are ability to learn/carryover information, co-morbidities, medical prognosis, previous level of function, and severity of impairments. Patient will benefit from skilled SLP services to address above impairments and improve overall function.  REHAB POTENTIAL: Fair neurodegenerative disorder  PLAN: SLP FREQUENCY: 1-2x/week  SLP DURATION: 8 weeks  PLANNED INTERVENTIONS: Language facilitation, Environmental controls, Cueing hierachy, Internal/external aids, Functional tasks, Multimodal communication approach, SLP instruction and feedback, Compensatory strategies, and Patient/family education    Niamh Rada B. Dreama Saa, M.S., CCC-SLP, Tree surgeon Certified Brain Injury Specialist [facility]  [facility] Rehabilitation Services Office [phone] Ascom [phone] Fax [phone]

## 2024-02-22 ENCOUNTER — Ambulatory Visit: Payer: Medicare HMO | Admitting: Speech Pathology

## 2024-02-22 NOTE — Addendum Note (Signed)
 Addended by: Reuel Derby B on: 02/22/2024 09:29 AM   Modules accepted: Orders

## 2024-02-26 ENCOUNTER — Ambulatory Visit: Payer: Medicare HMO | Admitting: Speech Pathology

## 2024-02-26 DIAGNOSIS — R4701 Aphasia: Secondary | ICD-10-CM

## 2024-02-26 DIAGNOSIS — R41841 Cognitive communication deficit: Secondary | ICD-10-CM

## 2024-02-26 NOTE — Therapy (Signed)
 OUTPATIENT SPEECH LANGUAGE PATHOLOGY TREATMENT NOTE    Patient Name: Carmen Gonzalez MRN: [id number] DOB:Mar 20, 1951, 73 y.o., female Today's Date: 02/26/2024  PCP: Maurine Minister, MD REFERRING PROVIDER: Cristopher Peru, MD     End of Session - 02/26/24 1020     Visit Number 11    Number of Visits 18    Date for SLP Re-Evaluation 04/16/24    Authorization Type [employer] Medicare HMO/PPO    Progress Note Due on Visit 20    SLP Start Time 1015    SLP Stop Time  1100    SLP Time Calculation (min) 45 min    Activity Tolerance Patient tolerated treatment well             Past Medical History:  Diagnosis Date   Allergy    Arthritis    Asthma    Cataract cortical, senile    COPD (chronic obstructive pulmonary disease) (HCC)    Deviated septum    Diabetes mellitus without complication (HCC)    Glaucoma    Hypercholesteremia    Hyperlipidemia    Hypertension    Obesity    Tuberculosis    Past Surgical History:  Procedure Laterality Date   COLONOSCOPY WITH PROPOFOL N/A 01/17/2016   Procedure: COLONOSCOPY WITH PROPOFOL;  Surgeon: Scot Jun, MD;  Location: [facility] ENDOSCOPY;  Service: Endoscopy;  Laterality: N/A;   COLONOSCOPY WITH PROPOFOL N/A 05/04/2021   Procedure: COLONOSCOPY WITH PROPOFOL;  Surgeon: Toledo, Boykin Nearing, MD;  Location: [facility] ENDOSCOPY;  Service: Gastroenterology;  Laterality: N/A;   CT ARTHROGRAM KNEE RT WO ([facility] HX)     NASAL FRACTURE SURGERY     There are no active problems to display for this patient.   ONSET DATE: 07/2018 onset of symptoms;  date of referral 12/10/2023  REFERRING DIAG: R41.89 (ICD-10-CM) - Cognitive change   THERAPY DIAG:  Aphasia  Cognitive communication deficit  Rationale for Evaluation and Treatment Rehabilitation  SUBJECTIVE:   PERTINENT HISTORY: Pt is a right handed 73 year old with diagnosis of Primary Progressive Aphasia (nonfluent variant). Previous courses of Outpatient ST services 04/2019-08/2019 and  10/2021-01/2022. Pt also has an AAC from [place].    DIAGNOSTIC FINDINGS:  12/17/2023 - MRI Parietal lob predominant parenchymal volume loss and probably chronic microvascular ischemic changes. No acute intracranial abnormality.    PAIN:  Are you having pain? No  FALLS: Has patient fallen in last 6 months?  No  LIVING ENVIRONMENT: Lives with: lives with their spouse Lives in: House/apartment  PLOF:  Level of assistance: Independent with ADLs, Independent with IADLs, Comment: decrease interest in participation Employment: Retired   PATIENT GOALS    pt's husband reports "improve confidence and participation in tasks" pt agreed with statement   SUBJECTIVE STATEMENT: Pt and her husband are pleasant and eager -  Pt accompanied by: significant other  OBJECTIVE:  TODAY'S TREATMENT:   Skilled treatment session focused on pt's cognitive linguistic communication goals. SLP facilitated session by providing the following interventions:  Pt with increased response to therapist's questions during today's session. Verbal expression appears physically effortful as pt was observed to be leaning forward with her arms on chair during her verbal responses. Responses are 1-2 word in length, halting in nature.   PATIENT EDUCATION: Education details: see above Person educated: Patient and Spouse Education method: Explanation Education comprehension: verbalized understanding  HOME EXERCISE PROGRAM:  Increase participation in daily activities    GOALS:  Goals reviewed with patient? Yes  SHORT TERM GOALS: Target date:  10 sessions  Updated 02/20/2024 With moderate assistance, pt will demonstrate selective attention to task for 10 minutes in a moderately distracting environment.  Baseline: Goal status: INITIAL: MET upgraded to with supervision assistance, pt will demonstrate selective attention to task for 10 minutes in a moderately distracting environment.   2.  Pt and her husband  will assist in creating scripts of basic wants/needs to promote functional language of wants/needs.  Baseline:  Goal status: INITIAL: progress made   LONG TERM GOALS: Target date: 04/16/2024  Updated 02/20/2024 With Moderate A, patient will use a circumlocution strategy, writing, drawing, and/or gesturing to describe target words with 75% accuracy to improve word-finding and reduce communication breakdowns, Baseline:  Goal status: INITIAL: discharged d/t pt inability  2.  With Min A, patient/family will demonstrate understanding of the following concepts: primary progressive aphasia, communication vs conversation, strengths/strategies to promote success, local resources by answering multiple choice questions with 75% accuracy when provided supported conversation in order to increase patient's participation in medical care.  Baseline:  Goal status: INITIAL: progress made  ASSESSMENT:  CLINICAL IMPRESSION: Patient is a 73 y.o. right handed female who was seen today for a cognitive communication treatment. She presents with progressive cognitive deficits related to diagnosis of Primary Progressive Aphasia (nonfluent variant). Pt with noted decreased attention to task, increased response times, short term memory deficits impacting her ability to recall locations of icons on hs AAC, difficulty with problem solving basic tasks such as plugging in her phone and decreased task initiation/activity participation.   Expressive language abilities are c/b halting speech with increased distortions of phonemes suspicious for motor speech impairment In addition, her responses are delayed and consistent mostly of noun/verb (3-4 words). Receptive language deficits are moderate and appear to have worsened impacting her ability to understand conversation and yes/no questions.  Pt and her husband report that she is not longer able to read or spell.    Pt with the support of her husband continue to implement  strategies/recommendations provided within the session. Pt more talkative during today's session. See the above treatment note for details.   OBJECTIVE IMPAIRMENTS include memory, expressive language, receptive language, and apraxia. These impairments are limiting patient from managing medications, managing appointments, managing finances, household responsibilities, ADLs/IADLs, and effectively communicating at home and in community. Factors affecting potential to achieve goals and functional outcome are ability to learn/carryover information, co-morbidities, medical prognosis, previous level of function, and severity of impairments. Patient will benefit from skilled SLP services to address above impairments and improve overall function.  REHAB POTENTIAL: Fair neurodegenerative disorder  PLAN: SLP FREQUENCY: 1-2x/week  SLP DURATION: 8 weeks  PLANNED INTERVENTIONS: Language facilitation, Environmental controls, Cueing hierachy, Internal/external aids, Functional tasks, Multimodal communication approach, SLP instruction and feedback, Compensatory strategies, and Patient/family education    Caleel Kiner B. Dreama Saa, M.S., CCC-SLP, Tree surgeon Certified Brain Injury Specialist [facility]  [facility] Rehabilitation Services Office [phone] Ascom [phone] Fax [phone]

## 2024-02-29 ENCOUNTER — Ambulatory Visit: Payer: Medicare HMO | Admitting: Speech Pathology

## 2024-03-04 ENCOUNTER — Ambulatory Visit: Payer: Medicare HMO | Admitting: Speech Pathology

## 2024-03-04 DIAGNOSIS — R41841 Cognitive communication deficit: Secondary | ICD-10-CM

## 2024-03-04 DIAGNOSIS — R4701 Aphasia: Secondary | ICD-10-CM

## 2024-03-04 NOTE — Therapy (Signed)
 OUTPATIENT SPEECH LANGUAGE PATHOLOGY TREATMENT NOTE DISCHARGE SUMMARY    Patient Name: Carmen Gonzalez MRN: [id number] DOB:[birth year], 73 y.o., female Today's Date: 03/04/2024  PCP: Nyla Bell, MD REFERRING PROVIDER: Devora Folks, MD     End of Session - 03/04/24 1202     Visit Number 12    Number of Visits 18    Date for SLP Re-Evaluation 04/16/24    Authorization Type [employer] Medicare HMO/PPO    Progress Note Due on Visit 20    SLP Start Time 1100    SLP Stop Time  1130    SLP Time Calculation (min) 30 min    Activity Tolerance Patient tolerated treatment well             Past Medical History:  Diagnosis Date   Allergy    Arthritis    Asthma    Cataract cortical, senile    COPD (chronic obstructive pulmonary disease) (HCC)    Deviated septum    Diabetes mellitus without complication (HCC)    Glaucoma    Hypercholesteremia    Hyperlipidemia    Hypertension    Obesity    Tuberculosis    Past Surgical History:  Procedure Laterality Date   COLONOSCOPY WITH PROPOFOL N/A 01/17/2016   Procedure: COLONOSCOPY WITH PROPOFOL;  Surgeon: Cassie Click, MD;  Location: [facility] ENDOSCOPY;  Service: Endoscopy;  Laterality: N/A;   COLONOSCOPY WITH PROPOFOL N/A 05/04/2021   Procedure: COLONOSCOPY WITH PROPOFOL;  Surgeon: Toledo, Alphonsus Jeans, MD;  Location: [facility] ENDOSCOPY;  Service: Gastroenterology;  Laterality: N/A;   CT ARTHROGRAM KNEE RT WO ([facility] HX)     NASAL FRACTURE SURGERY     There are no active problems to display for this patient.   ONSET DATE: 07/2018 onset of symptoms;  date of referral 12/10/2023  REFERRING DIAG: R41.89 (ICD-10-CM) - Cognitive change   THERAPY DIAG:  Aphasia  Cognitive communication deficit  Rationale for Evaluation and Treatment Rehabilitation  SUBJECTIVE:   PERTINENT HISTORY: Carmen Gonzalez is a right handed 73 year old with diagnosis of Primary Progressive Aphasia (nonfluent variant). Previous courses of Outpatient ST services  04/2019-08/2019 and 10/2021-01/2022. Carmen Gonzalez also has an AAC from [place].    DIAGNOSTIC FINDINGS:  12/17/2023 - MRI Parietal lob predominant parenchymal volume loss and probably chronic microvascular ischemic changes. No acute intracranial abnormality.    PAIN:  Are you having pain? No  FALLS: Has patient fallen in last 6 months?  No  LIVING ENVIRONMENT: Lives with: lives with their spouse Lives in: House/apartment  PLOF:  Level of assistance: Independent with ADLs, Independent with IADLs, Comment: decrease interest in participation Employment: Retired   PATIENT GOALS    Carmen Gonzalez's husband reports "improve confidence and participation in tasks" Carmen Gonzalez agreed with statement   SUBJECTIVE STATEMENT: Carmen Gonzalez and her husband are pleasant and eager -  Carmen Gonzalez accompanied by: significant other  OBJECTIVE:  TODAY'S TREATMENT:   Skilled treatment session focused on Carmen Gonzalez's cognitive linguistic communication goals. SLP facilitated session by providing the following interventions:  Carmen Gonzalez had just been to the dentist to have a cavity filled - part of her right mouth was still numb. Despite this, she and her husband report increased activities and Carmen Gonzalez involvement in activities.   Her husband reports that "on Sunday the little one had an [employer] - we got out there - and then we went out to eat." Carmen Gonzalez able to recall and name restaurant.   PATIENT EDUCATION: Education details: see above Person educated: Patient and Spouse Education  method: Explanation Education comprehension: verbalized understanding  HOME EXERCISE PROGRAM:  Increase participation in daily activities    GOALS:  Goals reviewed with patient? Yes  SHORT TERM GOALS: Target date: 10 sessions  Updated 02/20/2024  Updated 03/04/2024 With moderate assistance, Carmen Gonzalez will demonstrate selective attention to task for 10 minutes in a moderately distracting environment.  Baseline: Goal status: INITIAL: MET upgraded to with supervision assistance,  Carmen Gonzalez will demonstrate selective attention to task for 10 minutes in a moderately distracting environment. : MET, good improvement noted  2.  Carmen Gonzalez and her husband will assist in creating scripts of basic wants/needs to promote functional language of wants/needs.  Baseline:  Goal status: INITIAL: progress made: MET   LONG TERM GOALS: Target date: 04/16/2024  Updated 02/20/2024  Updated 03/04/2024 With Moderate A, patient will use a circumlocution strategy, writing, drawing, and/or gesturing to describe target words with 75% accuracy to improve word-finding and reduce communication breakdowns, Baseline:  Goal status: INITIAL: discharged d/t Carmen Gonzalez inability  2.  With Min A, patient/family will demonstrate understanding of the following concepts: primary progressive aphasia, communication vs conversation, strengths/strategies to promote success, local resources by answering multiple choice questions with 75% accuracy when provided supported conversation in order to increase patient's participation in medical care.  Baseline:  Goal status: INITIAL: progress made: MET  ASSESSMENT:  CLINICAL IMPRESSION: Patient is a 73 y.o. right handed female who was seen today for a cognitive communication treatment. She presents with progressive cognitive deficits related to diagnosis of Primary Progressive Aphasia (nonfluent variant). Carmen Gonzalez with noted decreased attention to task, increased response times, short term memory deficits impacting her ability to recall locations of icons on hs AAC, difficulty with problem solving basic tasks such as plugging in her phone and decreased task initiation/activity participation.   Expressive language abilities are c/b halting speech with increased distortions of phonemes suspicious for motor speech impairment In addition, her responses are delayed and consistent mostly of noun/verb (3-4 words). Receptive language deficits are moderate and appear to have worsened impacting her ability  to understand conversation and yes/no questions.  Carmen Gonzalez and her husband report that she is not longer able to read or spell.    Carmen Gonzalez with the support of her husband continue to implement strategies/recommendations provided within the session. Carmen Gonzalez more talkative during today's session. See the above treatment note for details.   At this time, Carmen Gonzalez appears to have reached the maximal benefit from skilled ST services. All education has been completed and Carmen Gonzalez and her husband voice improvement in Carmen Gonzalez participate in activities and agree with discharge.   OBJECTIVE IMPAIRMENTS include memory, expressive language, receptive language, and apraxia. These impairments are limiting patient from managing medications, managing appointments, managing finances, household responsibilities, ADLs/IADLs, and effectively communicating at home and in community. Factors affecting potential to achieve goals and functional outcome are ability to learn/carryover information, co-morbidities, medical prognosis, previous level of function, and severity of impairments. Patient will benefit from skilled SLP services to address above impairments and improve overall function.  REHAB POTENTIAL: Fair neurodegenerative disorder  PLAN: Carmen Gonzalez is appropriate for discharge.   Dae Highley B. Dreama Saa, M.S., CCC-SLP, Tree surgeon Certified Brain Injury Specialist [facility]  [facility] Rehabilitation Services Office [phone] Ascom [phone] Fax [phone]

## 2024-03-07 ENCOUNTER — Ambulatory Visit: Payer: Medicare HMO | Admitting: Speech Pathology

## 2024-03-11 ENCOUNTER — Ambulatory Visit: Payer: Medicare HMO | Admitting: Speech Pathology

## 2024-03-14 ENCOUNTER — Ambulatory Visit: Payer: Medicare HMO | Admitting: Speech Pathology

## 2024-03-18 ENCOUNTER — Ambulatory Visit: Payer: Medicare HMO | Admitting: Speech Pathology

## 2024-03-21 ENCOUNTER — Ambulatory Visit: Payer: Medicare HMO | Admitting: Speech Pathology

## 2024-03-25 ENCOUNTER — Ambulatory Visit: Payer: Medicare HMO | Admitting: Speech Pathology

## 2024-03-28 ENCOUNTER — Ambulatory Visit: Payer: Medicare HMO | Admitting: Speech Pathology

## 2024-04-01 ENCOUNTER — Ambulatory Visit: Payer: Medicare HMO | Admitting: Speech Pathology

## 2024-04-04 ENCOUNTER — Ambulatory Visit: Payer: Medicare HMO | Admitting: Speech Pathology

## 2024-04-08 ENCOUNTER — Ambulatory Visit: Payer: Medicare HMO | Admitting: Speech Pathology

## 2024-04-11 ENCOUNTER — Ambulatory Visit: Payer: Medicare HMO | Admitting: Speech Pathology

## 2024-04-15 ENCOUNTER — Ambulatory Visit: Payer: Medicare HMO | Admitting: Speech Pathology

## 2024-04-18 ENCOUNTER — Ambulatory Visit: Payer: Medicare HMO | Admitting: Speech Pathology

## 2024-04-22 ENCOUNTER — Ambulatory Visit: Payer: Medicare HMO | Admitting: Speech Pathology

## 2024-04-25 ENCOUNTER — Ambulatory Visit: Payer: Medicare HMO | Admitting: Speech Pathology

## 2024-04-29 ENCOUNTER — Ambulatory Visit: Payer: Medicare HMO | Admitting: Speech Pathology

## 2024-05-02 ENCOUNTER — Ambulatory Visit: Payer: Medicare HMO | Admitting: Speech Pathology

## 2024-05-06 ENCOUNTER — Ambulatory Visit: Payer: Medicare HMO | Admitting: Speech Pathology

## 2024-05-09 ENCOUNTER — Ambulatory Visit: Payer: Medicare HMO | Admitting: Speech Pathology

## 2024-05-13 ENCOUNTER — Ambulatory Visit: Payer: Medicare HMO | Admitting: Speech Pathology

## 2024-05-16 ENCOUNTER — Ambulatory Visit: Payer: Medicare HMO | Admitting: Speech Pathology

## 2024-05-20 ENCOUNTER — Ambulatory Visit: Payer: Medicare HMO | Admitting: Speech Pathology

## 2024-05-22 ENCOUNTER — Ambulatory Visit: Payer: Medicare HMO | Admitting: Speech Pathology

## 2024-05-27 ENCOUNTER — Ambulatory Visit: Payer: Medicare HMO | Admitting: Speech Pathology

## 2024-05-30 ENCOUNTER — Ambulatory Visit: Payer: Medicare HMO | Admitting: Speech Pathology

## 2024-06-03 ENCOUNTER — Ambulatory Visit: Payer: Medicare HMO | Admitting: Speech Pathology

## 2024-06-06 ENCOUNTER — Ambulatory Visit: Payer: Medicare HMO | Admitting: Speech Pathology

## 2024-12-08 ENCOUNTER — Other Ambulatory Visit: Payer: Self-pay | Admitting: Physician Assistant

## 2024-12-08 DIAGNOSIS — Z1231 Encounter for screening mammogram for malignant neoplasm of breast: Secondary | ICD-10-CM
